# Patient Record
Sex: Male | Born: 1970 | Race: White | Hispanic: No | Marital: Single | Smoking: Former smoker
Health system: Southern US, Community
[De-identification: ages and names within clinical notes are randomized; demographics above are authoritative.]

## PROBLEM LIST (undated history)

## (undated) DIAGNOSIS — F17200 Nicotine dependence, unspecified, uncomplicated: Secondary | ICD-10-CM

## (undated) DIAGNOSIS — G44209 Tension-type headache, unspecified, not intractable: Secondary | ICD-10-CM

## (undated) HISTORY — DX: Nicotine dependence, unspecified, uncomplicated: F17.200

## (undated) HISTORY — DX: Tension-type headache, unspecified, not intractable: G44.209

---

## 2007-09-20 ENCOUNTER — Ambulatory Visit: Payer: Self-pay | Admitting: Internal Medicine

## 2008-04-03 ENCOUNTER — Ambulatory Visit: Payer: Self-pay | Admitting: Internal Medicine

## 2008-04-03 DIAGNOSIS — G44209 Tension-type headache, unspecified, not intractable: Secondary | ICD-10-CM

## 2008-04-03 DIAGNOSIS — R51 Headache: Secondary | ICD-10-CM | POA: Insufficient documentation

## 2008-04-03 DIAGNOSIS — R519 Headache, unspecified: Secondary | ICD-10-CM | POA: Insufficient documentation

## 2008-04-03 DIAGNOSIS — R42 Dizziness and giddiness: Secondary | ICD-10-CM | POA: Insufficient documentation

## 2008-04-03 HISTORY — DX: Tension-type headache, unspecified, not intractable: G44.209

## 2008-04-27 ENCOUNTER — Ambulatory Visit: Payer: Self-pay | Admitting: Internal Medicine

## 2008-05-26 ENCOUNTER — Ambulatory Visit: Payer: Self-pay | Admitting: Family Medicine

## 2009-02-19 ENCOUNTER — Ambulatory Visit: Payer: Self-pay | Admitting: Internal Medicine

## 2009-02-19 DIAGNOSIS — J029 Acute pharyngitis, unspecified: Secondary | ICD-10-CM | POA: Insufficient documentation

## 2009-07-21 ENCOUNTER — Ambulatory Visit: Payer: Self-pay | Admitting: Internal Medicine

## 2009-07-21 LAB — CONVERTED CEMR LAB
ALT: 24 units/L (ref 0–53)
AST: 29 units/L (ref 0–37)
Albumin: 4.1 g/dL (ref 3.5–5.2)
Alkaline Phosphatase: 44 units/L (ref 39–117)
BUN: 19 mg/dL (ref 6–23)
Basophils Absolute: 0 10*3/uL (ref 0.0–0.1)
Basophils Relative: 0.8 % (ref 0.0–3.0)
Bilirubin Urine: NEGATIVE
Bilirubin, Direct: 0.1 mg/dL (ref 0.0–0.3)
CO2: 28 meq/L (ref 19–32)
Calcium: 9.4 mg/dL (ref 8.4–10.5)
Chloride: 103 meq/L (ref 96–112)
Cholesterol: 175 mg/dL (ref 0–200)
Creatinine, Ser: 1.1 mg/dL (ref 0.4–1.5)
Eosinophils Absolute: 0.2 10*3/uL (ref 0.0–0.7)
Eosinophils Relative: 3.3 % (ref 0.0–5.0)
GFR calc non Af Amer: 78.55 mL/min (ref >60)
Glucose, Bld: 97 mg/dL (ref 70–99)
Glucose, Urine, Semiquant: NEGATIVE
HCT: 45.4 % (ref 39.0–52.0)
HDL: 40.2 mg/dL (ref >39.00)
Hemoglobin: 15.3 g/dL (ref 13.0–17.0)
Ketones, urine, test strip: NEGATIVE
LDL Cholesterol: 124 mg/dL — ABNORMAL HIGH (ref 0–99)
Lymphocytes Relative: 41.8 % (ref 12.0–46.0)
Lymphs Abs: 2.5 10*3/uL (ref 0.7–4.0)
MCHC: 33.8 g/dL (ref 30.0–36.0)
MCV: 96.4 fL (ref 78.0–100.0)
Monocytes Absolute: 0.7 10*3/uL (ref 0.1–1.0)
Monocytes Relative: 12.5 % — ABNORMAL HIGH (ref 3.0–12.0)
Neutro Abs: 2.5 10*3/uL (ref 1.4–7.7)
Neutrophils Relative %: 41.6 % — ABNORMAL LOW (ref 43.0–77.0)
Nitrite: NEGATIVE
Platelets: 227 10*3/uL (ref 150.0–400.0)
Potassium: 4.5 meq/L (ref 3.5–5.1)
Protein, U semiquant: NEGATIVE
RBC: 4.71 M/uL (ref 4.22–5.81)
RDW: 12.1 % (ref 11.5–14.6)
Sodium: 139 meq/L (ref 135–145)
Specific Gravity, Urine: 1.025
TSH: 1.36 microintl units/mL (ref 0.35–5.50)
Total Bilirubin: 0.7 mg/dL (ref 0.3–1.2)
Total CHOL/HDL Ratio: 4
Total Protein: 7.1 g/dL (ref 6.0–8.3)
Triglycerides: 54 mg/dL (ref 0.0–149.0)
Urobilinogen, UA: 0.2
VLDL: 10.8 mg/dL (ref 0.0–40.0)
WBC Urine, dipstick: NEGATIVE
WBC: 5.9 10*3/uL (ref 4.5–10.5)
pH: 5

## 2009-08-05 ENCOUNTER — Ambulatory Visit: Payer: Self-pay | Admitting: Internal Medicine

## 2009-08-05 DIAGNOSIS — F17200 Nicotine dependence, unspecified, uncomplicated: Secondary | ICD-10-CM | POA: Insufficient documentation

## 2009-08-05 HISTORY — DX: Nicotine dependence, unspecified, uncomplicated: F17.200

## 2010-02-11 ENCOUNTER — Telehealth: Payer: Self-pay | Admitting: Internal Medicine

## 2010-11-15 NOTE — Assessment & Plan Note (Signed)
Summary: cpx/cjr   Vital Signs:  Patient profile:   40 year old male Weight:      210 pounds BMI:     28.58 BP sitting:   140 / 86  (left arm) Cuff size:   regular  Vitals Entered By: Raechel Ache, RN (August 05, 2009 8:56 AM) CC: CPX, labs done. Is Patient Diabetic? No   CC:  CPX and labs done.Marland Kitchen  History of Present Illness: 40 year old patient who is seen today for  health maintenance exam.  He enjoys excellent health and takes no chronic medications.  Preventive Screening-Counseling & Management  Alcohol-Tobacco     Smoking Status: current  Caffeine-Diet-Exercise     Does Patient Exercise: yes  Allergies: No Known Drug Allergies  Past History:  Past Medical History: Reviewed history from 04/03/2008 and no changes required. Headache  Past Surgical History: Reviewed history from 04/03/2008 and no changes required. none  Family History: Reviewed history from 04/03/2008 and no changes required. father age 51 in good health mother is 47.  History of hypertension since age 80, IGT   Two sisters with hypertension, obesity  Social History: Reviewed history from 04/03/2008 and no changes required. Single gay-no present domestic partner Regular exercise-yes Smoking Status:  current Does Patient Exercise:  yes  Review of Systems  The patient denies anorexia, fever, weight loss, weight gain, vision loss, decreased hearing, hoarseness, chest pain, syncope, dyspnea on exertion, peripheral edema, prolonged cough, headaches, hemoptysis, abdominal pain, melena, hematochezia, severe indigestion/heartburn, hematuria, incontinence, genital sores, muscle weakness, suspicious skin lesions, transient blindness, difficulty walking, depression, unusual weight change, abnormal bleeding, enlarged lymph nodes, angioedema, breast masses, and testicular masses.    Physical Exam  General:  Well-developed,well-nourished,in no acute distress; alert,appropriate and cooperative  throughout examination; 122/82 Head:  Normocephalic and atraumatic without obvious abnormalities. No apparent alopecia or balding. Eyes:  No corneal or conjunctival inflammation noted. EOMI. Perrla. Funduscopic exam benign, without hemorrhages, exudates or papilledema. Vision grossly normal. Ears:  External ear exam shows no significant lesions or deformities.  Otoscopic examination reveals clear canals, tympanic membranes are intact bilaterally without bulging, retraction, inflammation or discharge. Hearing is grossly normal bilaterally. Nose:  External nasal examination shows no deformity or inflammation. Nasal mucosa are pink and moist without lesions or exudates. Mouth:  Oral mucosa and oropharynx without lesions or exudates.  Teeth in good repair. Neck:  No deformities, masses, or tenderness noted. Chest Wall:  No deformities, masses, tenderness or gynecomastia noted. Breasts:  No masses or gynecomastia noted Lungs:  Normal respiratory effort, chest expands symmetrically. Lungs are clear to auscultation, no crackles or wheezes. Heart:  Normal rate and regular rhythm. S1 and S2 normal without gallop, murmur, click, rub or other extra sounds. Abdomen:  Bowel sounds positive,abdomen soft and non-tender without masses, organomegaly or hernias noted. Rectal:  No external abnormalities noted. Normal sphincter tone. No rectal masses or tenderness. Genitalia:  Testes bilaterally descended without nodularity, tenderness or masses. No scrotal masses or lesions. No penis lesions or urethral discharge. Prostate:  Prostate gland firm and smooth, no enlargement, nodularity, tenderness, mass, asymmetry or induration. Msk:  No deformity or scoliosis noted of thoracic or lumbar spine.   Pulses:  R and L carotid,radial,femoral,dorsalis pedis and posterior tibial pulses are full and equal bilaterally Extremities:  No clubbing, cyanosis, edema, or deformity noted with normal full range of motion of all joints.    Neurologic:  No cranial nerve deficits noted. Station and gait are normal. Plantar reflexes are down-going bilaterally.  DTRs are symmetrical throughout. Sensory, motor and coordinative functions appear intact. Skin:  Intact without suspicious lesions or rashes Cervical Nodes:  No lymphadenopathy noted Axillary Nodes:  No palpable lymphadenopathy Inguinal Nodes:  No significant adenopathy Psych:  Cognition and judgment appear intact. Alert and cooperative with normal attention span and concentration. No apparent delusions, illusions, hallucinations   Impression & Recommendations:  Problem # 1:  PREVENTIVE HEALTH CARE (ICD-V70.0)  Complete Medication List: 1)  Propecia 1 Mg Tabs (Finasteride) .Marland Kitchen.. 1 once daily  Patient Instructions: 1)  It is important that you exercise regularly at least 20 minutes 5 times a week. If you develop chest pain, have severe difficulty breathing, or feel very tired , stop exercising immediately and seek medical attention. Prescriptions: PROPECIA 1 MG  TABS (FINASTERIDE) 1 once daily  #90 x 6   Entered and Authorized by:   Gordy Savers  MD   Signed by:   Gordy Savers  MD on 08/05/2009   Method used:   Print then Give to Patient   RxID:   [id number]

## 2010-11-15 NOTE — Progress Notes (Signed)
Summary:  Propecia.refill  Phone Note Call from Patient Call back at [facility] Phone [phone]   Caller: Patient Summary of Call: Pt lost his wriiten script for Propecia. Please call in to [facility] on [address] [phone] Initial call taken by: Lucy Antigua,  February 11, 2010 1:37 PM    Prescriptions: PROPECIA 1 MG  TABS (FINASTERIDE) 1 once daily  #90 x 2   Entered by:   Duard Brady LPN   Authorized by:   Gordy Savers  MD   Signed by:   Duard Brady LPN on 47/82/9562   Method used:   Faxed to ...       [facility]  [address]  [phone]* (retail)       [address]       [place], [place]  [address]       Ph: [phone] or [phone]       Fax: [phone]   RxID:   [id number]

## 2011-04-18 ENCOUNTER — Other Ambulatory Visit: Payer: Self-pay | Admitting: Internal Medicine

## 2011-04-20 ENCOUNTER — Other Ambulatory Visit: Payer: Self-pay | Admitting: Internal Medicine

## 2011-05-25 ENCOUNTER — Telehealth: Payer: Self-pay | Admitting: Internal Medicine

## 2011-05-25 NOTE — Telephone Encounter (Signed)
It is concerned with his prescription of PROPECIA 1 MG tablet  because he is having issues refilling it every month. Please contact pt.

## 2011-05-29 NOTE — Telephone Encounter (Signed)
LMTCB if questions - last seen 2010 , keep appt 06/13/11 and will give 90day rx with 3RF at that time

## 2011-06-02 ENCOUNTER — Other Ambulatory Visit (INDEPENDENT_AMBULATORY_CARE_PROVIDER_SITE_OTHER): Payer: BC Managed Care – PPO

## 2011-06-02 DIAGNOSIS — Z0000 Encounter for general adult medical examination without abnormal findings: Secondary | ICD-10-CM

## 2011-06-02 LAB — HEPATIC FUNCTION PANEL
ALT: 21 U/L (ref 0–53)
AST: 26 U/L (ref 0–37)
Albumin: 4.2 g/dL (ref 3.5–5.2)
Alkaline Phosphatase: 46 U/L (ref 39–117)
Bilirubin, Direct: 0.1 mg/dL (ref 0.0–0.3)
Total Bilirubin: 0.6 mg/dL (ref 0.3–1.2)
Total Protein: 7 g/dL (ref 6.0–8.3)

## 2011-06-02 LAB — CBC WITH DIFFERENTIAL/PLATELET
Basophils Absolute: 0.1 10*3/uL (ref 0.0–0.1)
Basophils Relative: 0.8 % (ref 0.0–3.0)
Eosinophils Absolute: 0 10*3/uL (ref 0.0–0.7)
Eosinophils Relative: 0 % (ref 0.0–5.0)
HCT: 44 % (ref 39.0–52.0)
Hemoglobin: 14.6 g/dL (ref 13.0–17.0)
Lymphocytes Relative: 36.4 % (ref 12.0–46.0)
Lymphs Abs: 2.4 10*3/uL (ref 0.7–4.0)
MCHC: 33.2 g/dL (ref 30.0–36.0)
MCV: 96.8 fl (ref 78.0–100.0)
Monocytes Absolute: 0.9 10*3/uL (ref 0.1–1.0)
Monocytes Relative: 12.9 % — ABNORMAL HIGH (ref 3.0–12.0)
Neutro Abs: 3.3 10*3/uL (ref 1.4–7.7)
Neutrophils Relative %: 49.9 % (ref 43.0–77.0)
Platelets: 246 10*3/uL (ref 150.0–400.0)
RBC: 4.54 Mil/uL (ref 4.22–5.81)
RDW: 13.8 % (ref 11.5–14.6)
WBC: 6.7 10*3/uL (ref 4.5–10.5)

## 2011-06-02 LAB — BASIC METABOLIC PANEL
BUN: 18 mg/dL (ref 6–23)
CO2: 26 mEq/L (ref 19–32)
Calcium: 9.1 mg/dL (ref 8.4–10.5)
Chloride: 106 mEq/L (ref 96–112)
Creatinine, Ser: 1 mg/dL (ref 0.4–1.5)
GFR: 86.88 mL/min (ref >60.00)
Glucose, Bld: 97 mg/dL (ref 70–99)
Potassium: 4.5 mEq/L (ref 3.5–5.1)
Sodium: 140 mEq/L (ref 135–145)

## 2011-06-02 LAB — POCT URINALYSIS DIPSTICK
Bilirubin, UA: NEGATIVE
Blood, UA: NEGATIVE
Glucose, UA: NEGATIVE
Ketones, UA: NEGATIVE
Leukocytes, UA: NEGATIVE
Nitrite, UA: NEGATIVE
Protein, UA: NEGATIVE
Spec Grav, UA: 1.025
Urobilinogen, UA: 0.2
pH, UA: 5

## 2011-06-02 LAB — TSH: TSH: 1 u[IU]/mL (ref 0.35–5.50)

## 2011-06-02 LAB — LIPID PANEL
Cholesterol: 181 mg/dL (ref 0–200)
HDL: 49.7 mg/dL (ref >39.00)
LDL Cholesterol: 117 mg/dL — ABNORMAL HIGH (ref 0–99)
Total CHOL/HDL Ratio: 4
Triglycerides: 74 mg/dL (ref 0.0–149.0)
VLDL: 14.8 mg/dL (ref 0.0–40.0)

## 2011-06-02 LAB — PSA: PSA: 0.35 ng/mL (ref 0.10–4.00)

## 2011-06-12 ENCOUNTER — Encounter: Payer: Self-pay | Admitting: Internal Medicine

## 2011-06-13 ENCOUNTER — Ambulatory Visit (INDEPENDENT_AMBULATORY_CARE_PROVIDER_SITE_OTHER): Payer: BC Managed Care – PPO | Admitting: Internal Medicine

## 2011-06-13 ENCOUNTER — Encounter: Payer: Self-pay | Admitting: Internal Medicine

## 2011-06-13 VITALS — BP 122/80 | HR 68 | Temp 98.3°F | Resp 16 | Ht 72.75 in | Wt 218.0 lb

## 2011-06-13 DIAGNOSIS — Z0000 Encounter for general adult medical examination without abnormal findings: Secondary | ICD-10-CM

## 2011-06-13 MED ORDER — FINASTERIDE 1 MG PO TABS: 1.0000 mg | ORAL_TABLET | Freq: Every day | ORAL | Status: DC

## 2011-06-13 NOTE — Progress Notes (Signed)
Subjective:    Patient ID: Lee Holt, male    DOB: 04-09-1971, 40 y.o.   MRN: [id number]  HPI History of Present Illness:   40 year-old patient who is seen today for health maintenance exam. He enjoys excellent health and takes no chronic medications.   Preventive Screening-Counseling & Management  Alcohol-Tobacco  Smoking Status: current  Caffeine-Diet-Exercise  Does Patient Exercise: yes   Allergies:  No Known Drug Allergies   Past History:  Past Medical History:  Reviewed history from 04/03/2008 and no changes required.  Headache   Past Surgical History:  Reviewed history from 04/03/2008 and no changes required.  None   Family History:  Reviewed history from 04/03/2008 and no changes required.  father age 69 in good health  mother is 20. History of hypertension since age 66, IGT  Two sisters with hypertension, obesity   Social History:  Reviewed history from 04/03/2008 and no changes required.  Single  gay-no present domestic partner  Regular exercise-yes  Smoking Status: current  Does Patient Exercise: yes    Review of Systems  Constitutional: Negative for fever, chills, activity change, appetite change and fatigue.  HENT: Negative for hearing loss, ear pain, congestion, rhinorrhea, sneezing, mouth sores, trouble swallowing, neck pain, neck stiffness, dental problem, voice change, sinus pressure and tinnitus.   Eyes: Negative for photophobia, pain, redness and visual disturbance.  Respiratory: Negative for apnea, cough, choking, chest tightness, shortness of breath and wheezing.   Cardiovascular: Negative for chest pain, palpitations and leg swelling.  Gastrointestinal: Negative for nausea, vomiting, abdominal pain, diarrhea, constipation, blood in stool, abdominal distention, anal bleeding and rectal pain.  Genitourinary: Negative for dysuria, urgency, frequency, hematuria, flank pain, decreased urine volume, discharge, penile swelling, scrotal swelling,  difficulty urinating, genital sores and testicular pain.  Musculoskeletal: Negative for myalgias, back pain, joint swelling, arthralgias and gait problem.  Skin: Negative for color change, rash and wound.  Neurological: Negative for dizziness, tremors, seizures, syncope, facial asymmetry, speech difficulty, weakness, light-headedness, numbness and headaches.  Hematological: Negative for adenopathy. Does not bruise/bleed easily.  Psychiatric/Behavioral: Negative for suicidal ideas, hallucinations, behavioral problems, confusion, sleep disturbance, self-injury, dysphoric mood, decreased concentration and agitation. The patient is not nervous/anxious.        Objective:   Physical Exam  Constitutional: He appears well-developed and well-nourished.  HENT:  Head: Normocephalic and atraumatic.  Right Ear: External ear normal.  Left Ear: External ear normal.  Nose: Nose normal.  Mouth/Throat: Oropharynx is clear and moist.  Eyes: Conjunctivae and EOM are normal. Pupils are equal, round, and reactive to light. No scleral icterus.  Neck: Normal range of motion. Neck supple. No JVD present. No thyromegaly present.  Cardiovascular: Regular rhythm, normal heart sounds and intact distal pulses.  Exam reveals no gallop and no friction rub.   No murmur heard. Pulmonary/Chest: Effort normal and breath sounds normal. He exhibits no tenderness.  Abdominal: Soft. Bowel sounds are normal. He exhibits no distension and no mass. There is no tenderness.  Genitourinary: Prostate normal and penis normal.  Musculoskeletal: Normal range of motion. He exhibits no edema and no tenderness.  Lymphadenopathy:    He has no cervical adenopathy.  Neurological: He is alert. He has normal reflexes. No cranial nerve deficit. Coordination normal.  Skin: Skin is warm and dry. No rash noted.  Psychiatric: He has a normal mood and affect. His behavior is normal.          Assessment & Plan:    Preventive health Ongoing  tobacco use  Laboratory studies were reviewed with the patient. Smoking cessation encouraged

## 2011-06-13 NOTE — Patient Instructions (Signed)
Smoking tobacco is very bad for your health. You should stop smoking immediately.    It is important that you exercise regularly, at least 20 minutes 3 to 4 times per week.  If you develop chest pain or shortness of breath seek  medical attention.   

## 2011-06-26 ENCOUNTER — Telehealth: Payer: Self-pay

## 2011-06-26 NOTE — Telephone Encounter (Signed)
VM - would like rx for finasteride 5mg  - co pay monthly is very low for generic at 5mg . Brand is too exspensive Please advise

## 2011-06-26 NOTE — Telephone Encounter (Signed)
#  90  RF 3 

## 2011-06-27 MED ORDER — FINASTERIDE 5 MG PO TABS: 5.0000 mg | ORAL_TABLET | Freq: Every day | ORAL | Status: AC

## 2011-06-27 NOTE — Telephone Encounter (Signed)
Change to med list done and new rx sent kik

## 2011-08-16 ENCOUNTER — Telehealth: Payer: Self-pay | Admitting: Internal Medicine

## 2011-08-16 NOTE — Telephone Encounter (Signed)
Last seen in office 06/12/12 - I do not see this on his current med list - please advise

## 2011-08-16 NOTE — Telephone Encounter (Signed)
Pt called and said that he has a cold sore on lip. Pt is req a script for Valtrex to bed called in to [facility] on [address]. Pt is aware that pcp is out of office. Pt would like to be notified when this has been called in.

## 2011-08-17 MED ORDER — ACYCLOVIR 400 MG PO TABS: 400.0000 mg | ORAL_TABLET | Freq: Every day | ORAL | Status: AC

## 2011-08-17 NOTE — Telephone Encounter (Signed)
Acyclovir 400 mg, dispense 50 tabs......directions one tab 5 times a day for cold, sore, refills x 2

## 2012-05-08 ENCOUNTER — Telehealth: Payer: Self-pay | Admitting: Internal Medicine

## 2012-05-08 MED ORDER — ACYCLOVIR 400 MG PO TABS: 400.0000 mg | ORAL_TABLET | ORAL | Status: AC

## 2012-05-08 NOTE — Telephone Encounter (Signed)
Please advise 

## 2012-05-08 NOTE — Telephone Encounter (Signed)
Acyclovir 400 #10 one twice a day for 5 days  refill x3

## 2012-05-08 NOTE — Telephone Encounter (Signed)
Pt is going to be traveling out of town and has a cold sore. Pt is req to get a refill of acyclovir (ZOVIRAX) 400 MG tablet or Valtrex called in to [facility] on [address] asap today. Pt says that if this can not be done today, pls call.

## 2012-05-08 NOTE — Telephone Encounter (Signed)
Done - [facility] - with instructions for them to call when ready for pick up

## 2012-08-08 ENCOUNTER — Encounter: Payer: Self-pay | Admitting: Sports Medicine

## 2012-08-08 ENCOUNTER — Ambulatory Visit (INDEPENDENT_AMBULATORY_CARE_PROVIDER_SITE_OTHER): Payer: BC Managed Care – PPO | Admitting: Sports Medicine

## 2012-08-08 VITALS — BP 132/82 | HR 73 | Ht 72.0 in | Wt 190.0 lb

## 2012-08-08 DIAGNOSIS — M25529 Pain in unspecified elbow: Secondary | ICD-10-CM

## 2012-08-08 DIAGNOSIS — M7700 Medial epicondylitis, unspecified elbow: Secondary | ICD-10-CM

## 2012-08-08 DIAGNOSIS — M7701 Medial epicondylitis, right elbow: Secondary | ICD-10-CM | POA: Insufficient documentation

## 2012-08-08 MED ORDER — MELOXICAM 15 MG PO TABS: 15.0000 mg | ORAL_TABLET | Freq: Every day | ORAL | Status: DC | PRN

## 2012-08-08 NOTE — Progress Notes (Signed)
Patient ID: Lee Holt, male   DOB: [birth year], 41 y.o.   MRN: [id number]  Mr. Camilo Libengood is a 41 y/o male complaining of right elbow, medial, pain x 2 months.  Pt recently started playing with new competition and has been hitting the ball harder, getting more torque on ball because of increased competition.  Pt recently adjusted his swing, string tension and grip within this same time.  Pt plays at level 4/5 and is playing against level 5 competition.    Pt started feeling radiation from medial elbow to wrist during recent matches.  Pt also had recent string adjustment.  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    ROS - History of migraine headache   OBJECTIVE: Vital signs as noted above. Appearance: Well appearing male in no acute distress  Elbow exam: No gross erythema, edema or abnormalities on inspection of elbow joint. Right arm is approximately 6 cm longer than left in complete flexion with elbow extended Right forearm and hand musculature asymmetric compared to left, demonstrating significant hypertrophy of flexor surface and intrinsic hand musculature  AROM is symmetric and appropriate in bilateral wrist and elbows in flexion and extension; no restrict moving RIGHT arm into complete extension. 5/5 strength in bilateral wrist flexion, extension but pain reproduced with slight resistance on wrist flexion.  Minimal pain with resisted flexion of lateral three fingers. Exquisite point tenderness at distal aspect of medial epicondyle of humerus on RIGHT but no palpable defects or crepitus   RIGHT elbow MSK U/S: Pt tender to pressure from U/S probe at medial epicondyle of RIGHT arm.  Visualization of common flexor tendon revealed calcification at medial epicondyle at attachment of flexor tendon group.  Hypoechoic regions visualized near attachment of group at epicondyle in multiple points and  proximal to insertion.  Small collections of fluid present in same areas noted above. neovessels noted in 2 areas of the tendon complex  ASSESSMENT: Medial Epicondylitis of Right elbow, Acute   PLAN: History and physical exam suggestive of medial epicondylitis secondary to increased training/match intensity, new swing, new grip and change in string tension.  Sonographic evidence of epicondylitis without overt tear on U/S.  Pt provided with rehabilitation exercises for wrist and fingers to address flexor compartment.  Pt also given body helix forearm sleeve, oral meloxicam (scheduled 7 days, then prn) and instructed to pickup topical asper cream.  Pt agreed with above treatment plan and will f/u as needed.  Instructed to continue training at lower intensity and play/practice at pain free level of intensity.  Pt will f/u 4 weeks  - Kenney Houseman, MS IV

## 2012-08-08 NOTE — Patient Instructions (Addendum)
Wrist / Finger Rehab Exercises Wrist: 1 - Move into wrist extension (slow) then flexion (fast) with light weight, 3 pounds 2 - Move into pronation or "palm down" (slow) then supination "palm up" (fast)  Fingers: 3 - Repeat extension (slow) and flexion (fast) with fingers, using a therabad we provided  - Please wear elbow sleeve during exercise and at least 1 hour afterward - Please use topical asper cream - Please use mobic (meloxicam) 7.5mg  daily for seven days, then as needed

## 2012-08-08 NOTE — Assessment & Plan Note (Signed)
We will give him meloxicam to use orally. Once this is less painful he should rely more on topical analgesics such as Aspercreme.

## 2012-08-08 NOTE — Assessment & Plan Note (Signed)
Begin a series of flexion exercises pain at rehabilitation of the right medial epicondyle   elbow compression sleeve for tennis and keep on for one hour afterwards  Ice after activity  Change tennis strings to 50 pounds or below

## 2012-09-05 ENCOUNTER — Ambulatory Visit: Payer: BC Managed Care – PPO | Admitting: Sports Medicine

## 2012-10-23 ENCOUNTER — Other Ambulatory Visit: Payer: Self-pay | Admitting: Internal Medicine

## 2013-03-03 ENCOUNTER — Other Ambulatory Visit: Payer: Self-pay | Admitting: Internal Medicine

## 2013-03-04 NOTE — Telephone Encounter (Signed)
PT called to inquire about this refill, he also stated that he will be leaving town soon. Please assist.

## 2013-03-25 ENCOUNTER — Other Ambulatory Visit: Payer: Self-pay | Admitting: Internal Medicine

## 2013-04-10 ENCOUNTER — Other Ambulatory Visit (INDEPENDENT_AMBULATORY_CARE_PROVIDER_SITE_OTHER): Payer: BC Managed Care – PPO

## 2013-04-10 DIAGNOSIS — Z0000 Encounter for general adult medical examination without abnormal findings: Secondary | ICD-10-CM

## 2013-04-10 LAB — POCT URINALYSIS DIPSTICK
Bilirubin, UA: NEGATIVE
Glucose, UA: NEGATIVE
Ketones, UA: NEGATIVE
Leukocytes, UA: NEGATIVE
Nitrite, UA: NEGATIVE
Protein, UA: NEGATIVE
Spec Grav, UA: >=1.03
Urobilinogen, UA: 0.2
pH, UA: 5

## 2013-04-10 LAB — CBC WITH DIFFERENTIAL/PLATELET
Basophils Absolute: 0 10*3/uL (ref 0.0–0.1)
Basophils Relative: 0.6 % (ref 0.0–3.0)
Eosinophils Absolute: 0.1 10*3/uL (ref 0.0–0.7)
Eosinophils Relative: 1.8 % (ref 0.0–5.0)
HCT: 45.6 % (ref 39.0–52.0)
Hemoglobin: 15.4 g/dL (ref 13.0–17.0)
Lymphocytes Relative: 39.9 % (ref 12.0–46.0)
Lymphs Abs: 2.9 10*3/uL (ref 0.7–4.0)
MCHC: 33.8 g/dL (ref 30.0–36.0)
MCV: 96.9 fl (ref 78.0–100.0)
Monocytes Absolute: 0.9 10*3/uL (ref 0.1–1.0)
Monocytes Relative: 12.6 % — ABNORMAL HIGH (ref 3.0–12.0)
Neutro Abs: 3.3 10*3/uL (ref 1.4–7.7)
Neutrophils Relative %: 45.1 % (ref 43.0–77.0)
Platelets: 277 10*3/uL (ref 150.0–400.0)
RBC: 4.71 Mil/uL (ref 4.22–5.81)
RDW: 13.6 % (ref 11.5–14.6)
WBC: 7.3 10*3/uL (ref 4.5–10.5)

## 2013-04-10 LAB — BASIC METABOLIC PANEL
BUN: 15 mg/dL (ref 6–23)
CO2: 26 mEq/L (ref 19–32)
Calcium: 9.3 mg/dL (ref 8.4–10.5)
Chloride: 103 mEq/L (ref 96–112)
Creatinine, Ser: 0.9 mg/dL (ref 0.4–1.5)
GFR: 92.51 mL/min (ref >60.00)
Glucose, Bld: 108 mg/dL — ABNORMAL HIGH (ref 70–99)
Potassium: 4.1 mEq/L (ref 3.5–5.1)
Sodium: 137 mEq/L (ref 135–145)

## 2013-04-10 LAB — LIPID PANEL
Cholesterol: 193 mg/dL (ref 0–200)
HDL: 44.6 mg/dL (ref >39.00)
LDL Cholesterol: 128 mg/dL — ABNORMAL HIGH (ref 0–99)
Total CHOL/HDL Ratio: 4
Triglycerides: 104 mg/dL (ref 0.0–149.0)
VLDL: 20.8 mg/dL (ref 0.0–40.0)

## 2013-04-10 LAB — HEPATIC FUNCTION PANEL
ALT: 21 U/L (ref 0–53)
AST: 26 U/L (ref 0–37)
Albumin: 4.2 g/dL (ref 3.5–5.2)
Alkaline Phosphatase: 44 U/L (ref 39–117)
Bilirubin, Direct: 0 mg/dL (ref 0.0–0.3)
Total Bilirubin: 0.4 mg/dL (ref 0.3–1.2)
Total Protein: 7.5 g/dL (ref 6.0–8.3)

## 2013-04-10 LAB — TSH: TSH: 1.76 u[IU]/mL (ref 0.35–5.50)

## 2013-04-15 ENCOUNTER — Encounter: Payer: Self-pay | Admitting: Internal Medicine

## 2013-04-15 ENCOUNTER — Ambulatory Visit (INDEPENDENT_AMBULATORY_CARE_PROVIDER_SITE_OTHER): Payer: BC Managed Care – PPO | Admitting: Internal Medicine

## 2013-04-15 VITALS — BP 140/80 | HR 63 | Temp 98.7°F | Resp 18 | Ht 72.0 in | Wt 219.0 lb

## 2013-04-15 DIAGNOSIS — Z23 Encounter for immunization: Secondary | ICD-10-CM

## 2013-04-15 DIAGNOSIS — Z0000 Encounter for general adult medical examination without abnormal findings: Secondary | ICD-10-CM

## 2013-04-15 MED ORDER — FINASTERIDE 1 MG PO TABS: ORAL_TABLET | ORAL | Status: DC

## 2013-04-15 MED ORDER — VALACYCLOVIR HCL 500 MG PO TABS: 500.0000 mg | ORAL_TABLET | Freq: Two times a day (BID) | ORAL | Status: DC

## 2013-04-15 NOTE — Progress Notes (Signed)
Patient ID: Lee Holt, male   DOB: 1971/04/12, 42 y.o.   MRN: [id number]  Subjective:    Patient ID: Lee Holt, male    DOB: 10-02-1971, 42 y.o.   MRN: [id number]  HPI History of Present Illness:   42  year-old patient who is seen today for health maintenance exam. He enjoys excellent health and takes no chronic medications.   Preventive Screening-Counseling & Management  Alcohol-Tobacco  Smoking Status: current  Caffeine-Diet-Exercise  Does Patient Exercise: yes   Allergies:  No Known Drug Allergies   Past History:  Past Medical History:  Reviewed history from 04/03/2008 and no changes required.  Headache   Past Surgical History:  Reviewed history from 04/03/2008 and no changes required.  None   Family History:  Reviewed history from 04/03/2008 and no changes required.  father age 79 in good health  mother is 43. History of hypertension since age 63, DM2 Two sisters with hypertension, obesity   Social History:  Reviewed history from 04/03/2008 and no changes required.  Single  gay-no present domestic partner  Regular exercise-yes  Smoking Status: current  1/2 ppd Does Patient Exercise: yes    Review of Systems  Constitutional: Negative for fever, chills, activity change, appetite change and fatigue.  HENT: Negative for hearing loss, ear pain, congestion, rhinorrhea, sneezing, mouth sores, trouble swallowing, neck pain, neck stiffness, dental problem, voice change, sinus pressure and tinnitus.   Eyes: Negative for photophobia, pain, redness and visual disturbance.  Respiratory: Negative for apnea, cough, choking, chest tightness, shortness of breath and wheezing.   Cardiovascular: Negative for chest pain, palpitations and leg swelling.  Gastrointestinal: Negative for nausea, vomiting, abdominal pain, diarrhea, constipation, blood in stool, abdominal distention, anal bleeding and rectal pain.  Genitourinary: Negative for dysuria, urgency, frequency, hematuria,  flank pain, decreased urine volume, discharge, penile swelling, scrotal swelling, difficulty urinating, genital sores and testicular pain.  Musculoskeletal: Negative for myalgias, back pain, joint swelling, arthralgias and gait problem.  Skin: Negative for color change, rash and wound.  Neurological: Negative for dizziness, tremors, seizures, syncope, facial asymmetry, speech difficulty, weakness, light-headedness, numbness and headaches.  Hematological: Negative for adenopathy. Does not bruise/bleed easily.  Psychiatric/Behavioral: Negative for suicidal ideas, hallucinations, behavioral problems, confusion, sleep disturbance, self-injury, dysphoric mood, decreased concentration and agitation. The patient is not nervous/anxious.        Objective:   Physical Exam  Constitutional: He appears well-developed and well-nourished.  HENT:  Head: Normocephalic and atraumatic.  Right Ear: External ear normal.  Left Ear: External ear normal.  Nose: Nose normal.  Mouth/Throat: Oropharynx is clear and moist.  Eyes: Conjunctivae and EOM are normal. Pupils are equal, round, and reactive to light. No scleral icterus.  Neck: Normal range of motion. Neck supple. No JVD present. No thyromegaly present.  Cardiovascular: Regular rhythm, normal heart sounds and intact distal pulses.  Exam reveals no gallop and no friction rub.   No murmur heard. Pulmonary/Chest: Effort normal and breath sounds normal. He exhibits no tenderness.  Abdominal: Soft. Bowel sounds are normal. He exhibits no distension and no mass. There is no tenderness.  Genitourinary: Prostate normal and penis normal.  Musculoskeletal: Normal range of motion. He exhibits no edema and no tenderness.  Lymphadenopathy:    He has no cervical adenopathy.  Neurological: He is alert. He has normal reflexes. No cranial nerve deficit. Coordination normal.  Skin: Skin is warm and dry. No rash noted.  Psychiatric: He has a normal mood and affect. His  behavior is normal.  Assessment & Plan:    Preventive health Ongoing tobacco use Possible impaired glucose tolerance Overweight  Laboratory studies were reviewed with the patient. Smoking cessation encouraged Weight loss and regular exercise regimen encouraged

## 2013-04-15 NOTE — Patient Instructions (Addendum)
Use Valtrex twice daily for 5 days as needed for outbreaks of oral ulcers    It is important that you exercise regularly, at least 20 minutes 3 to 4 times per week.  If you develop chest pain or shortness of breath seek  medical attention.  You need to lose weight.  Consider a lower calorie diet and regular exercise.Health Maintenance, Males A healthy lifestyle and preventative care can promote health and wellness.  Maintain regular health, dental, and eye exams.  Eat a healthy diet. Foods like vegetables, fruits, whole grains, low-fat dairy products, and lean protein foods contain the nutrients you need without too many calories. Decrease your intake of foods high in solid fats, added sugars, and salt. Get information about a proper diet from your caregiver, if necessary.  Regular physical exercise is one of the most important things you can do for your health. Most adults should get at least 150 minutes of moderate-intensity exercise (any activity that increases your heart rate and causes you to sweat) each week. In addition, most adults need muscle-strengthening exercises on 2 or more days a week.   Maintain a healthy weight. The body mass index (BMI) is a screening tool to identify possible weight problems. It provides an estimate of body fat based on height and weight. Your caregiver can help determine your BMI, and can help you achieve or maintain a healthy weight. For adults 20 years and older:  A BMI below 18.5 is considered underweight.  A BMI of 18.5 to 24.9 is normal.  A BMI of 25 to 29.9 is considered overweight.  A BMI of 30 and above is considered obese.  Maintain normal blood lipids and cholesterol by exercising and minimizing your intake of saturated fat. Eat a balanced diet with plenty of fruits and vegetables. Blood tests for lipids and cholesterol should begin at age 32 and be repeated every 5 years. If your lipid or cholesterol levels are high, you are over 50, or you are  a high risk for heart disease, you may need your cholesterol levels checked more frequently.Ongoing high lipid and cholesterol levels should be treated with medicines, if diet and exercise are not effective.  If you smoke, find out from your caregiver how to quit. If you do not use tobacco, do not start.  If you choose to drink alcohol, do not exceed 2 drinks per day. One drink is considered to be 12 ounces (355 mL) of beer, 5 ounces (148 mL) of wine, or 1.5 ounces (44 mL) of liquor.  Avoid use of street drugs. Do not share needles with anyone. Ask for help if you need support or instructions about stopping the use of drugs.  High blood pressure causes heart disease and increases the risk of stroke. Blood pressure should be checked at least every 1 to 2 years. Ongoing high blood pressure should be treated with medicines if weight loss and exercise are not effective.  If you are 2 to 44 years old, ask your caregiver if you should take aspirin to prevent heart disease.  Diabetes screening involves taking a blood sample to check your fasting blood sugar level. This should be done once every 3 years, after age 21, if you are within normal weight and without risk factors for diabetes. Testing should be considered at a younger age or be carried out more frequently if you are overweight and have at least 1 risk factor for diabetes.  Colorectal cancer can be detected and often prevented. Most routine  colorectal cancer screening begins at the age of 58 and continues through age 53. However, your caregiver may recommend screening at an earlier age if you have risk factors for colon cancer. On a yearly basis, your caregiver may provide home test kits to check for hidden blood in the stool. Use of a small camera at the end of a tube, to directly examine the colon (sigmoidoscopy or colonoscopy), can detect the earliest forms of colorectal cancer. Talk to your caregiver about this at age 54, when routine screening  begins. Direct examination of the colon should be repeated every 5 to 10 years through age 39, unless early forms of pre-cancerous polyps or small growths are found.  Hepatitis C blood testing is recommended for all people born from [birth year] through 1965 and any individual with known risks for hepatitis C.  Healthy men should no longer receive prostate-specific antigen (PSA) blood tests as part of routine cancer screening. Consult with your caregiver about prostate cancer screening.  Testicular cancer screening is not recommended for adolescents or adult males who have no symptoms. Screening includes self-exam, caregiver exam, and other screening tests. Consult with your caregiver about any symptoms you have or any concerns you have about testicular cancer.  Practice safe sex. Use condoms and avoid high-risk sexual practices to reduce the spread of sexually transmitted infections (STIs).  Use sunscreen with a sun protection factor (SPF) of 30 or greater. Apply sunscreen liberally and repeatedly throughout the day. You should seek shade when your shadow is shorter than you. Protect yourself by wearing long sleeves, pants, a wide-brimmed hat, and sunglasses year round, whenever you are outdoors.  Notify your caregiver of new moles or changes in moles, especially if there is a change in shape or color. Also notify your caregiver if a mole is larger than the size of a pencil eraser.  A one-time screening for abdominal aortic aneurysm (AAA) and surgical repair of large AAAs by sound wave imaging (ultrasonography) is recommended for ages 106 to 60 years who are current or former smokers.  Stay current with your immunizations. Document Released: 03/30/2008 Document Revised: 12/25/2011 Document Reviewed: 02/27/2011 [facility] Patient Information 2014 [place], [place].

## 2014-01-04 ENCOUNTER — Other Ambulatory Visit: Payer: Self-pay | Admitting: Internal Medicine

## 2014-05-05 ENCOUNTER — Other Ambulatory Visit: Payer: Self-pay | Admitting: Internal Medicine

## 2014-05-07 ENCOUNTER — Ambulatory Visit (INDEPENDENT_AMBULATORY_CARE_PROVIDER_SITE_OTHER): Payer: BC Managed Care – PPO | Admitting: Sports Medicine

## 2014-05-07 ENCOUNTER — Encounter: Payer: Self-pay | Admitting: Sports Medicine

## 2014-05-07 ENCOUNTER — Other Ambulatory Visit: Payer: Self-pay | Admitting: Internal Medicine

## 2014-05-07 VITALS — BP 122/77 | Ht 72.0 in | Wt 200.0 lb

## 2014-05-07 DIAGNOSIS — M25569 Pain in unspecified knee: Secondary | ICD-10-CM

## 2014-05-07 DIAGNOSIS — M25561 Pain in right knee: Secondary | ICD-10-CM | POA: Insufficient documentation

## 2014-05-07 NOTE — Progress Notes (Signed)
  Lee Holt - 43 y.o. male MRN [id number]  Date of birth: 03/05/1971    SUBJECTIVE:     Lee Holt reports lateral right knee pain beginning in February/March.  He does not recall any specific injury but thinks and maybe do to overly aggressive yoga.  He notices the pain most when playing tennis.  Does notice some mild swelling with physical activity, but denies any locking or giving way.  Pain is greatest with knee flexion.  He has been wearing his friends knee brace with rigid support and has noticed some improvement with this.  He has also tried some dry needling with minimal improvement.  He denies any previous right knee or hip injuries or surgeries.   ROS:     Denies fevers, chills, or erythema  PERTINENT  PMH / PSH FH / / SH:  Past Medical, Surgical, Social, and Family History Reviewed & Updated in the EMR.  Pertinent findings include:   None  OBJECTIVE: BP 122/77  Ht 6' (1.829 m)  Wt 200 lb (90.719 kg)  BMI 27.12 kg/m2  Physical Exam:  Vital signs are reviewed.  Right Knee: Normal to inspection with no erythema or effusion or obvious bony abnormalities. Palpation: Tenderness along lateral tibial plateau and femoral condyle; Otherwise normal with no warmth, joint line tenderness, patellar tenderness, or medial condyle tenderness. ROM full in flexion and extension; however flexion is painful at 30 deg and then feels snap and pain relief as he squats past this level  Ligaments with solid consistent endpoints including ACL, PCL, LCL, MCL. Negative Mcmurray's, Apley's, and anterior/posterior drawer Gets some pain on thessaly and on modified Nyland test  Non painful patellar compression. Patellar glide with mild crepitus. Patellar and quadriceps tendons unremarkable. Hamstring and quadriceps strength is normal.   Right knee ultrasound - IT band calcifications visualized/ some hypoechoic changes - Quadriceps and patellar tendons normal/  SP pouch visualized without effusion;  MCL and medial meniscus as well as LCL and lateral meniscus appear intact.  Popliteus visualized without surrounding edema   ASSESSMENT & PLAN:  See problem based charting & AVS for pt instructions.

## 2014-05-07 NOTE — Assessment & Plan Note (Signed)
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  ITB calcifications visualized on ultrasound - Fitted with knee compression sleeve - Patient instructed on IT band stretches and exercises - Discussed icing ITB post physical activity as well as foam roller - Followup in 4-6 weeks for reevaluation

## 2014-05-07 NOTE — Patient Instructions (Signed)
It was great seeing you today.   1. Do IT band stretches and exercises > 4 times a week 1. Build up to weighted lunges: Straight, lateral and medial: 3 sets of 15 2. Use compression sleeve when physically active especially playing tennis 3. You can Ice your IT band after playing tennis: Frozen dixie cup 4. F/u in 4-6 weeks  Next Appointment  Make an appointment with Dr Darrick PennaFields in 4-6 weeks   Take Care,   Dr Wenda LowJames Jakarius Flamenco

## 2014-05-14 ENCOUNTER — Ambulatory Visit: Payer: BC Managed Care – PPO | Admitting: Sports Medicine

## 2014-06-08 ENCOUNTER — Ambulatory Visit (INDEPENDENT_AMBULATORY_CARE_PROVIDER_SITE_OTHER): Payer: BC Managed Care – PPO | Admitting: Internal Medicine

## 2014-06-08 ENCOUNTER — Encounter: Payer: Self-pay | Admitting: Internal Medicine

## 2014-06-08 ENCOUNTER — Telehealth: Payer: Self-pay | Admitting: Internal Medicine

## 2014-06-08 VITALS — BP 130/80 | HR 66 | Temp 98.2°F | Resp 20 | Ht 72.0 in | Wt 212.0 lb

## 2014-06-08 DIAGNOSIS — F17200 Nicotine dependence, unspecified, uncomplicated: Secondary | ICD-10-CM

## 2014-06-08 NOTE — Patient Instructions (Signed)
Smoking tobacco is very bad for your health. You should stop smoking immediately.  Return in one year for follow-up  Health Maintenance A healthy lifestyle and preventative care can promote health and wellness.  Maintain regular health, dental, and eye exams.  Eat a healthy diet. Foods like vegetables, fruits, whole grains, low-fat dairy products, and lean protein foods contain the nutrients you need and are low in calories. Decrease your intake of foods high in solid fats, added sugars, and salt. Get information about a proper diet from your health care provider, if necessary.  Regular physical exercise is one of the most important things you can do for your health. Most adults should get at least 150 minutes of moderate-intensity exercise (any activity that increases your heart rate and causes you to sweat) each week. In addition, most adults need muscle-strengthening exercises on 2 or more days a week.   Maintain a healthy weight. The body mass index (BMI) is a screening tool to identify possible weight problems. It provides an estimate of body fat based on height and weight. Your health care provider can find your BMI and can help you achieve or maintain a healthy weight. For males 20 years and older:  A BMI below 18.5 is considered underweight.  A BMI of 18.5 to 24.9 is normal.  A BMI of 25 to 29.9 is considered overweight.  A BMI of 30 and above is considered obese.  Maintain normal blood lipids and cholesterol by exercising and minimizing your intake of saturated fat. Eat a balanced diet with plenty of fruits and vegetables. Blood tests for lipids and cholesterol should begin at age 36 and be repeated every 5 years. If your lipid or cholesterol levels are high, you are over age 107, or you are at high risk for heart disease, you may need your cholesterol levels checked more frequently.Ongoing high lipid and cholesterol levels should be treated with medicines if diet and exercise are  not working.  If you smoke, find out from your health care provider how to quit. If you do not use tobacco, do not start.  Lung cancer screening is recommended for adults aged 55-80 years who are at high risk for developing lung cancer because of a history of smoking. A yearly low-dose CT scan of the lungs is recommended for people who have at least a 30-pack-year history of smoking and are current smokers or have quit within the past 15 years. A pack year of smoking is smoking an average of 1 pack of cigarettes a day for 1 year (for example, a 30-pack-year history of smoking could mean smoking 1 pack a day for 30 years or 2 packs a day for 15 years). Yearly screening should continue until the smoker has stopped smoking for at least 15 years. Yearly screening should be stopped for people who develop a health problem that would prevent them from having lung cancer treatment.  If you choose to drink alcohol, do not have more than 2 drinks per day. One drink is considered to be 12 oz (360 mL) of beer, 5 oz (150 mL) of wine, or 1.5 oz (45 mL) of liquor.  Avoid the use of street drugs. Do not share needles with anyone. Ask for help if you need support or instructions about stopping the use of drugs.  High blood pressure causes heart disease and increases the risk of stroke. Blood pressure should be checked at least every 1-2 years. Ongoing high blood pressure should be treated with medicines  if weight loss and exercise are not effective.  If you are 1-58 years old, ask your health care provider if you should take aspirin to prevent heart disease.  Diabetes screening involves taking a blood sample to check your fasting blood sugar level. This should be done once every 3 years after age 33 if you are at a normal weight and without risk factors for diabetes. Testing should be considered at a younger age or be carried out more frequently if you are overweight and have at least 1 risk factor for  diabetes.  Colorectal cancer can be detected and often prevented. Most routine colorectal cancer screening begins at the age of 61 and continues through age 89. However, your health care provider may recommend screening at an earlier age if you have risk factors for colon cancer. On a yearly basis, your health care provider may provide home test kits to check for hidden blood in the stool. A small camera at the end of a tube may be used to directly examine the colon (sigmoidoscopy or colonoscopy) to detect the earliest forms of colorectal cancer. Talk to your health care provider about this at age 79 when routine screening begins. A direct exam of the colon should be repeated every 5-10 years through age 33, unless early forms of precancerous polyps or small growths are found.  People who are at an increased risk for hepatitis B should be screened for this virus. You are considered at high risk for hepatitis B if:  You were born in a country where hepatitis B occurs often. Talk with your health care provider about which countries are considered high risk.  Your parents were born in a high-risk country and you have not received a shot to protect against hepatitis B (hepatitis B vaccine).  You have HIV or AIDS.  You use needles to inject street drugs.  You live with, or have sex with, someone who has hepatitis B.  You are a man who has sex with other men (MSM).  You get hemodialysis treatment.  You take certain medicines for conditions like cancer, organ transplantation, and autoimmune conditions.  Hepatitis C blood testing is recommended for all people born from [birth year] through 1965 and any individual with known risk factors for hepatitis C.  Healthy men should no longer receive prostate-specific antigen (PSA) blood tests as part of routine cancer screening. Talk to your health care provider about prostate cancer screening.  Testicular cancer screening is not recommended for adolescents or  adult males who have no symptoms. Screening includes self-exam, a health care provider exam, and other screening tests. Consult with your health care provider about any symptoms you have or any concerns you have about testicular cancer.  Practice safe sex. Use condoms and avoid high-risk sexual practices to reduce the spread of sexually transmitted infections (STIs).  You should be screened for STIs, including gonorrhea and chlamydia if:  You are sexually active and are younger than 24 years.  You are older than 24 years, and your health care provider tells you that you are at risk for this type of infection.  Your sexual activity has changed since you were last screened, and you are at an increased risk for chlamydia or gonorrhea. Ask your health care provider if you are at risk.  If you are at risk of being infected with HIV, it is recommended that you take a prescription medicine daily to prevent HIV infection. This is called pre-exposure prophylaxis (PrEP). You are considered at  risk if:  You are a man who has sex with other men (MSM).  You are a heterosexual man who is sexually active with multiple partners.  You take drugs by injection.  You are sexually active with a partner who has HIV.  Talk with your health care provider about whether you are at high risk of being infected with HIV. If you choose to begin PrEP, you should first be tested for HIV. You should then be tested every 3 months for as long as you are taking PrEP.  Use sunscreen. Apply sunscreen liberally and repeatedly throughout the day. You should seek shade when your shadow is shorter than you. Protect yourself by wearing long sleeves, pants, a wide-brimmed hat, and sunglasses year round whenever you are outdoors.  Tell your health care provider of new moles or changes in moles, especially if there is a change in shape or color. Also, tell your health care provider if a mole is larger than the size of a pencil  eraser.  A one-time screening for abdominal aortic aneurysm (AAA) and surgical repair of large AAAs by ultrasound is recommended for men aged 44-75 years who are current or former smokers.  Stay current with your vaccines (immunizations). Document Released: 03/30/2008 Document Revised: 10/07/2013 Document Reviewed: 02/27/2011 [facility] Patient Information 2015 [employer], [place]. This information is not intended to replace advice given to you by your health care provider. Make sure you discuss any questions you have with your health care provider.

## 2014-06-08 NOTE — Progress Notes (Signed)
Subjective:    Patient ID: Lee Holt, male    DOB: Mar 19, 1971, 43 y.o.   MRN: [id number]  HPI  43 year old patient, who presents with a four-week history of nonspecific testicular discomfort.  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  He describes a mild nocturia, decreased urinary flow.  No other concerns or complaints.  He is seen one month ago for right knee pain  Past Medical History  Diagnosis Date  . TENSION HEADACHES, CHRONIC 04/03/2008  . TOBACCO USER 08/05/2009    History   Social History  . Marital Status: Single    Spouse Name: N/A    Number of Children: N/A  . Years of Education: N/A   Occupational History  . Not on file.   Social History Main Topics  . Smoking status: Current Every Day Smoker -- 0.50 packs/day    Types: Cigarettes  . Smokeless tobacco: Never Used  . Alcohol Use: Yes     Comment: occasionaly  . Drug Use: No  . Sexual Activity: Not on file   Other Topics Concern  . Not on file   Social History Narrative  . No narrative on file    No past surgical history on file.  Family History  Problem Relation Age of Onset  . Hypertension Mother     No Known Allergies  Current Outpatient Prescriptions on File Prior to Visit  Medication Sig Dispense Refill  . acyclovir (ZOVIRAX) 400 MG tablet Take 1 tablet by mouth every 4 (four) hours while awake .  10 tablet  2  . finasteride (PROPECIA) 1 MG tablet TAKE ONE TABLET BY MOUTH EVERY DAY  90 tablet  0  . valACYclovir (VALTREX) 500 MG tablet Take 1 tablet (500 mg total) by mouth 2 (two) times daily.  30 tablet  2   No current facility-administered medications on file prior to visit.    BP 130/80  Pulse 66  Temp(Src) 98.2 F (36.8 C) (Oral)  Resp 20  Ht 6' (1.829 m)  Wt 212 lb (96.163 kg)  BMI 28.75 kg/m2  SpO2 98%     Review of Systems  Constitutional: Negative for fever, chills, appetite change and fatigue.  HENT: Negative for congestion, dental problem, ear  pain, hearing loss, sore throat, tinnitus, trouble swallowing and voice change.   Eyes: Negative for pain, discharge and visual disturbance.  Respiratory: Negative for cough, chest tightness, wheezing and stridor.   Cardiovascular: Negative for chest pain, palpitations and leg swelling.  Gastrointestinal: Negative for nausea, vomiting, abdominal pain, diarrhea, constipation, blood in stool and abdominal distention.  Genitourinary: Positive for frequency and testicular pain. Negative for urgency, hematuria, flank pain, discharge, difficulty urinating and genital sores.  Musculoskeletal: Negative for arthralgias, back pain, gait problem, joint swelling, myalgias and neck stiffness.  Skin: Negative for rash.  Neurological: Negative for dizziness, syncope, speech difficulty, weakness, numbness and headaches.  Hematological: Negative for adenopathy. Does not bruise/bleed easily.  Psychiatric/Behavioral: Negative for behavioral problems and dysphoric mood. The patient is not nervous/anxious.        Objective:   Physical Exam  Constitutional: He is oriented to person, place, and time. He appears well-developed.  HENT:  Head: Normocephalic.  Right Ear: External ear normal.  Left Ear: External ear normal.  Eyes: Conjunctivae and EOM are normal.  Neck: Normal range of motion.  Cardiovascular: Normal rate and normal heart sounds.   Pulmonary/Chest: Breath sounds normal.  Abdominal: Bowel sounds are normal.  Genitourinary: Prostate normal  and penis normal. Guaiac negative stool.  Musculoskeletal: Normal range of motion. He exhibits no edema and no tenderness.  Neurological: He is alert and oriented to person, place, and time.  Psychiatric: He has a normal mood and affect. His behavior is normal.          Assessment & Plan:   Nonspecific testicular discomfort.  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

## 2014-06-08 NOTE — Telephone Encounter (Signed)
Relevant patient education assigned to patient using Emmi. ° °

## 2014-06-08 NOTE — Progress Notes (Signed)
Pre visit review using our clinic review tool, if applicable. No additional management support is needed unless otherwise documented below in the visit note. 

## 2015-02-26 ENCOUNTER — Other Ambulatory Visit: Payer: Self-pay | Admitting: Internal Medicine

## 2015-08-02 ENCOUNTER — Other Ambulatory Visit: Payer: Self-pay | Admitting: Internal Medicine

## 2015-08-06 ENCOUNTER — Other Ambulatory Visit: Payer: Self-pay | Admitting: Internal Medicine

## 2016-02-06 ENCOUNTER — Ambulatory Visit (INDEPENDENT_AMBULATORY_CARE_PROVIDER_SITE_OTHER): Payer: BLUE CROSS/BLUE SHIELD | Admitting: Physician Assistant

## 2016-02-06 VITALS — BP 110/76 | HR 69 | Temp 98.3°F | Resp 16 | Ht 73.0 in | Wt 212.0 lb

## 2016-02-06 DIAGNOSIS — H9201 Otalgia, right ear: Secondary | ICD-10-CM | POA: Diagnosis not present

## 2016-02-06 DIAGNOSIS — J029 Acute pharyngitis, unspecified: Secondary | ICD-10-CM

## 2016-02-06 LAB — POCT RAPID STREP A (OFFICE): Rapid Strep A Screen: NEGATIVE

## 2016-02-06 NOTE — Patient Instructions (Addendum)
Your strep swab was negative. You likely have either allergies with post nasal drip causing your symptoms or a viral URI causing your symptoms. Taking tylenol daily will help with the pain. Throat lozenges will help.  Taking a decongestant like sudafed will help with the congestion which may help with post nasal drip and sore throat.  Using a humidifier in the room at night should help.   Sore Throat A sore throat is pain, burning, irritation, or scratchiness of the throat. There is often pain or tenderness when swallowing or talking. A sore throat may be accompanied by other symptoms, such as coughing, sneezing, fever, and swollen neck glands. A sore throat is often the first sign of another sickness, such as a cold, flu, strep throat, or mononucleosis (commonly known as mono). Most sore throats go away without medical treatment. CAUSES  The most common causes of a sore throat include:  A viral infection, such as a cold, flu, or mono.  A bacterial infection, such as strep throat, tonsillitis, or whooping cough.  Seasonal allergies.  Dryness in the air.  Irritants, such as smoke or pollution.  Gastroesophageal reflux disease (GERD). HOME CARE INSTRUCTIONS   Only take over-the-counter medicines as directed by your caregiver.  Drink enough fluids to keep your urine clear or pale yellow.  Rest as needed.  Try using throat sprays, lozenges, or sucking on hard candy to ease any pain (if older than 4 years or as directed).  Sip warm liquids, such as broth, herbal tea, or warm water with honey to relieve pain temporarily. You may also eat or drink cold or frozen liquids such as frozen ice pops.  Gargle with salt water (mix 1 tsp salt with 8 oz of water).  Do not smoke and avoid secondhand smoke.  Put a cool-mist humidifier in your bedroom at night to moisten the air. You can also turn on a hot shower and sit in the bathroom with the door closed for 5-10 minutes. SEEK IMMEDIATE MEDICAL  CARE IF:  You have difficulty breathing.  You are unable to swallow fluids, soft foods, or your saliva.  You have increased swelling in the throat.  Your sore throat does not get better in 7 days.  You have nausea and vomiting.  You have a fever or persistent symptoms for more than 2-3 days.  You have a fever and your symptoms suddenly get worse. MAKE SURE YOU:   Understand these instructions.  Will watch your condition.  Will get help right away if you are not doing well or get worse.   This information is not intended to replace advice given to you by your health care provider. Make sure you discuss any questions you have with your health care provider.   Document Released: 11/09/2004 Document Revised: 10/23/2014 Document Reviewed: 06/09/2012 [employer] Interactive Patient Education [employer]2016 [employer]

## 2016-02-06 NOTE — Progress Notes (Signed)
   Subjective:    Patient ID: Lee Holt, male    DOB: 1971-05-13, 45 y.o.   MRN: [id number]  Chief Complaint  Patient presents with  . Sore Throat    for a few weeks  . Ear Pain    right sided   Medications, allergies, past medical history, surgical history, family history, social history and problem list reviewed and updated.  HPI  [age] yom presents with above symptoms.   Head/nasal congestion, allergies past couple weeks with sneezing. Now past few days with persistent ST. Trouble swallowing. Having post nasal drip. Denies cough. Has not taken anything for allergies. Denies fevers, chills. Looked in mirror yesterday and noticed bright red and white spots.   Right ear pain past week. No hearing changes.   Review of Systems No abd pain, n/v, diarrhea.     Objective:   Physical Exam  Constitutional: He appears well-developed and well-nourished.  Non-toxic appearance. He does not have a sickly appearance. He does not appear ill. No distress.  BP 110/76 mmHg  Pulse 69  Temp(Src) 98.3 F (36.8 C)  Resp 16  Ht 6\' 1"  (1.854 m)  Wt 212 lb (96.163 kg)  BMI 27.98 kg/m2  SpO2 98%   HENT:  Right Ear: Tympanic membrane normal. No swelling or tenderness. No mastoid tenderness. Tympanic membrane is not erythematous. No middle ear effusion.  Left Ear: Tympanic membrane normal.  Nose: No mucosal edema or rhinorrhea. Right sinus exhibits no maxillary sinus tenderness and no frontal sinus tenderness. Left sinus exhibits no maxillary sinus tenderness and no frontal sinus tenderness.  Mouth/Throat: Uvula is midline and mucous membranes are normal. Posterior oropharyngeal erythema present. No oropharyngeal exudate.  Neck: No Brudzinski's sign noted.  Pulmonary/Chest: Effort normal and breath sounds normal. No tachypnea.  Lymphadenopathy:       Head (right side): Submandibular adenopathy present. No submental and no tonsillar adenopathy present.       Head (left side): Submandibular  adenopathy present. No submental and no tonsillar adenopathy present.    He has no cervical adenopathy.  Tender submandibular LAN.    Results for orders placed or performed in visit on 02/06/16  POCT rapid strep A  Result Value Ref Range   Rapid Strep A Screen Negative Negative      Assessment & Plan:   Sore throat - Plan: POCT rapid strep A  Otalgia, right --1/4 centor criteria, negative strep swab --suspect allergic rhinitis/viral uri leading to post nasal drip/sore throat --tylenol, fluids, lozenges, sudafed for congestion -- zyrtec/flonae for allergies if symptoms persist   Donnajean Lopesodd M. Fatumata Kashani, PA-C Physician Assistant-Certified [facility]  02/06/2016 1:01 PM

## 2016-02-07 ENCOUNTER — Ambulatory Visit: Payer: Self-pay | Admitting: Internal Medicine

## 2016-02-17 ENCOUNTER — Telehealth: Payer: Self-pay | Admitting: Internal Medicine

## 2016-02-17 NOTE — Telephone Encounter (Signed)
Okay to refill Finasteride 1 mg. Pt is scheduled for CPE 6/23?

## 2016-02-17 NOTE — Telephone Encounter (Signed)
Pt need new Rx for finasteride 1 mg.  Pharm:  [facility] on [address].  Pt has a 6/23 CPE appt.

## 2016-02-18 MED ORDER — FINASTERIDE 1 MG PO TABS: 1.0000 mg | ORAL_TABLET | Freq: Every day | ORAL | Status: DC

## 2016-02-18 NOTE — Telephone Encounter (Signed)
Okay 

## 2016-02-18 NOTE — Telephone Encounter (Signed)
Left message on voicemail Rx sent to pharmacy as requested. 

## 2016-02-22 ENCOUNTER — Encounter: Payer: Self-pay | Admitting: Internal Medicine

## 2016-02-22 ENCOUNTER — Ambulatory Visit (INDEPENDENT_AMBULATORY_CARE_PROVIDER_SITE_OTHER): Payer: BLUE CROSS/BLUE SHIELD | Admitting: Internal Medicine

## 2016-02-22 VITALS — BP 140/80 | HR 66 | Temp 98.3°F | Resp 20 | Ht 73.0 in | Wt 218.0 lb

## 2016-02-22 DIAGNOSIS — J3089 Other allergic rhinitis: Secondary | ICD-10-CM

## 2016-02-22 MED ORDER — ACYCLOVIR 400 MG PO TABS: 400.0000 mg | ORAL_TABLET | Freq: Two times a day (BID) | ORAL | Status: DC

## 2016-02-22 MED ORDER — PREDNISONE 20 MG PO TABS: 20.0000 mg | ORAL_TABLET | Freq: Two times a day (BID) | ORAL | Status: DC

## 2016-02-22 MED ORDER — FINASTERIDE 1 MG PO TABS: 1.0000 mg | ORAL_TABLET | Freq: Every day | ORAL | Status: DC

## 2016-02-22 MED ORDER — FLUTICASONE PROPIONATE 50 MCG/ACT NA SUSP: 1.0000 | Freq: Every day | NASAL | Status: DC

## 2016-02-22 NOTE — Progress Notes (Signed)
Subjective:    Patient ID: Lee Holt, male    DOB: 20-Nov-1970, 45 y.o.   MRN: [id number]  HPI  45 year old patient who presents with symptoms since January or February of this year.  He describes a sense of burning right ear fullness in the throat, associated with postnasal drip and nasal congestion.  Denies any fever.  Symptoms have not been severe, but have been persistent, causing concern.  He was seen in the urgent care on 02/06/2016.  Rapid strep at that time was negative.  He has been using Sudafed only.  He discontinued tobacco products about 2 months ago. \He is scheduled for a physical in about 2 months  This year, he has been sleeping with his elderly dog  Past Medical History  Diagnosis Date  . TENSION HEADACHES, CHRONIC 04/03/2008  . TOBACCO USER 08/05/2009     Social History   Social History  . Marital Status: Single    Spouse Name: N/A  . Number of Children: N/A  . Years of Education: N/A   Occupational History  . Not on file.   Social History Main Topics  . Smoking status: Current Some Day Smoker -- 0.50 packs/day    Types: Cigarettes  . Smokeless tobacco: Never Used  . Alcohol Use: 0.0 oz/week    0 Standard drinks or equivalent per week     Comment: occasionaly  . Drug Use: No  . Sexual Activity: Not on file   Other Topics Concern  . Not on file   Social History Narrative    No past surgical history on file.  Family History  Problem Relation Age of Onset  . Hypertension Mother     No Known Allergies  Current Outpatient Prescriptions on File Prior to Visit  Medication Sig Dispense Refill  . acyclovir (ZOVIRAX) 400 MG tablet Take 1 tablet by mouth every 4 (four) hours while awake . 10 tablet 2  . finasteride (PROPECIA) 1 MG tablet Take 1 tablet (1 mg total) by mouth daily. 90 tablet 0   No current facility-administered medications on file prior to visit.    BP 140/80 mmHg  Pulse 66  Temp(Src) 98.3 F (36.8 C) (Oral)  Resp 20   Ht 6\' 1"  (1.854 m)  Wt 218 lb (98.884 kg)  BMI 28.77 kg/m2  SpO2 98%     Review of Systems  Constitutional: Negative for fever, chills, appetite change and fatigue.  HENT: Positive for congestion, postnasal drip, sinus pressure and sore throat. Negative for dental problem, ear pain, hearing loss, tinnitus, trouble swallowing and voice change.   Eyes: Negative for pain, discharge and visual disturbance.  Respiratory: Negative for cough, chest tightness, wheezing and stridor.   Cardiovascular: Negative for chest pain, palpitations and leg swelling.  Gastrointestinal: Negative for nausea, vomiting, abdominal pain, diarrhea, constipation, blood in stool and abdominal distention.  Genitourinary: Negative for urgency, hematuria, flank pain, discharge, difficulty urinating and genital sores.  Musculoskeletal: Negative for myalgias, back pain, joint swelling, arthralgias, gait problem and neck stiffness.  Skin: Negative for rash.  Neurological: Negative for dizziness, syncope, speech difficulty, weakness, numbness and headaches.  Hematological: Negative for adenopathy. Does not bruise/bleed easily.  Psychiatric/Behavioral: Negative for behavioral problems and dysphoric mood. The patient is not nervous/anxious.    it     Objective:   Physical Exam  Constitutional: He is oriented to person, place, and time. He appears well-developed.  HENT:  Head: Normocephalic.  Right Ear: External ear normal.  Left Ear: External  ear normal.  Very mild injection of the oral pharynx.  Both tympanic membranes were normal  Eyes: Conjunctivae and EOM are normal.  Neck: Normal range of motion.  Cardiovascular: Normal rate and normal heart sounds.   Pulmonary/Chest: Breath sounds normal.  Abdominal: Bowel sounds are normal.  Musculoskeletal: Normal range of motion. He exhibits no edema or tenderness.  Neurological: He is alert and oriented to person, place, and time.  Psychiatric: He has a normal mood and  affect. His behavior is normal.          Assessment & Plan:     Probable allergic rhinitis.   Avoidance measures discussed. Will treat with Zyrtec, Flonase, brief course of oral prednisone  CPX as scheduled

## 2016-02-22 NOTE — Progress Notes (Signed)
Pre visit review using our clinic review tool, if applicable. No additional management support is needed unless otherwise documented below in the visit note. 

## 2016-02-22 NOTE — Patient Instructions (Addendum)
Use a nonsedating antihistamine such as Alavert, Claritin or Zyrtec  Fluticasone nasal spray  Prednisone twice daily as prescribed  HOME CARE INSTRUCTIONS  Drink plenty of water. Water helps thin the mucus so your sinuses can drain more easily.  Use a humidifier.  Inhale steam 3-4 times a day (for example, sit in the bathroom with the shower running).  Apply a warm, moist washcloth to your face 3-4 times a day, or as directed by your health care provider.  Use saline nasal sprays to help moisten and clean your sinuses.

## 2016-03-06 ENCOUNTER — Ambulatory Visit (INDEPENDENT_AMBULATORY_CARE_PROVIDER_SITE_OTHER): Payer: BLUE CROSS/BLUE SHIELD | Admitting: Family Medicine

## 2016-03-06 VITALS — BP 112/77 | HR 95 | Temp 98.0°F | Resp 16 | Ht 71.9 in | Wt 213.4 lb

## 2016-03-06 DIAGNOSIS — R51 Headache: Secondary | ICD-10-CM | POA: Diagnosis not present

## 2016-03-06 DIAGNOSIS — R112 Nausea with vomiting, unspecified: Secondary | ICD-10-CM

## 2016-03-06 DIAGNOSIS — R1084 Generalized abdominal pain: Secondary | ICD-10-CM

## 2016-03-06 DIAGNOSIS — E860 Dehydration: Secondary | ICD-10-CM | POA: Diagnosis not present

## 2016-03-06 DIAGNOSIS — R519 Headache, unspecified: Secondary | ICD-10-CM

## 2016-03-06 LAB — POC MICROSCOPIC URINALYSIS (UMFC): Mucus: ABSENT

## 2016-03-06 LAB — POCT URINALYSIS DIP (MANUAL ENTRY)
Bilirubin, UA: NEGATIVE
Blood, UA: NEGATIVE
Glucose, UA: NEGATIVE
Ketones, POC UA: NEGATIVE
Leukocytes, UA: NEGATIVE
Nitrite, UA: NEGATIVE
Protein Ur, POC: 30 — AB
Spec Grav, UA: 1.015
Urobilinogen, UA: 0.2
pH, UA: 8.5

## 2016-03-06 MED ORDER — PROMETHAZINE HCL 25 MG/ML IJ SOLN
25.0000 mg | Freq: Once | INTRAMUSCULAR | Status: AC
  Administered 2016-03-06: 25 mg via INTRAMUSCULAR

## 2016-03-06 MED ORDER — ONDANSETRON 4 MG PO TBDP
8.0000 mg | ORAL_TABLET | Freq: Once | ORAL | Status: AC
  Administered 2016-03-06: 8 mg via ORAL

## 2016-03-06 MED ORDER — ONDANSETRON 8 MG PO TBDP: 8.0000 mg | ORAL_TABLET | Freq: Three times a day (TID) | ORAL | Status: DC | PRN

## 2016-03-06 MED ORDER — KETOROLAC TROMETHAMINE 30 MG/ML IJ SOLN
30.0000 mg | Freq: Once | INTRAMUSCULAR | Status: AC
  Administered 2016-03-06: 30 mg via INTRAMUSCULAR

## 2016-03-06 NOTE — Progress Notes (Signed)
Subjective:  By signing my name below, I, Lee Holt, attest that this documentation has been prepared under the direction and in the presence of Lee Sorenson, MD. Electronically Signed: Stann Holt, Scribe. 03/06/2016 , 7:00 PM .  Patient was seen in Room 4 .   Patient ID: Lee Holt, male    DOB: 1971-01-31, 45 y.o.   MRN: [id number] Chief Complaint  Patient presents with  . Abdominal Pain  . Nausea  . Emesis  . BODYACHES   HPI Lee Holt is a 45 y.o. male who presents to [facility] complaining of "crampy" abdominal pain with nausea, vomiting and myalgia. Patient states he's been vomiting every 3-4 hours today. He hasn't been able to keep anything down. He's tried taking pepto but he's thrown this up as well. Due to the inability to keep anything down, he notes being "super thirsty". He describes "urine being the color of brass". He notes feeling lightheaded and dizziness, which he believes to be associated with the nausea. He denies past surgical history of abdominal surgeries.   He does mention that vomiting makes him feel better, but the pain creeps back up.   Past Medical History  Diagnosis Date  . TENSION HEADACHES, CHRONIC 04/03/2008  . TOBACCO USER 08/05/2009   Prior to Admission medications   Medication Sig Start Date End Date Taking? Authorizing Provider  acyclovir (ZOVIRAX) 400 MG tablet Take 1 tablet (400 mg total) by mouth 2 (two) times daily. 02/22/16  Yes Gordy Savers, MD  finasteride (PROPECIA) 1 MG tablet Take 1 tablet (1 mg total) by mouth daily. 02/22/16  Yes Gordy Savers, MD   No Known Allergies  No past surgical history on file.  Review of Systems  Constitutional: Positive for activity change, appetite change and fatigue. Negative for fever and chills.  Gastrointestinal: Positive for nausea, vomiting and abdominal pain. Negative for diarrhea, constipation and abdominal distention.  Endocrine: Negative for polydipsia and polyphagia.    Genitourinary: Positive for decreased urine volume. Negative for dysuria, urgency, frequency and difficulty urinating.  Musculoskeletal: Positive for myalgias.  Allergic/Immunologic: Negative for food allergies and immunocompromised state.  Neurological: Positive for dizziness, weakness, light-headedness and headaches. Negative for syncope and speech difficulty.  Hematological: Negative for adenopathy.  Psychiatric/Behavioral: Positive for sleep disturbance.      Objective:   Physical Exam  Constitutional: He is oriented to person, place, and time. He appears well-developed and well-nourished. No distress.  HENT:  Head: Normocephalic and atraumatic.  Eyes: EOM are normal. Pupils are equal, round, and reactive to light.  Neck: Neck supple.  Cardiovascular: Normal rate, regular rhythm, S1 normal, S2 normal and normal heart sounds.   No murmur heard. Pulmonary/Chest: Effort normal. No respiratory distress.  Abdominal: Bowel sounds are decreased. There is generalized tenderness. There is no rebound and no guarding.  Musculoskeletal: Normal range of motion.  Neurological: He is alert and oriented to person, place, and time.  Skin: Skin is warm and dry.  Psychiatric: He has a normal mood and affect. His behavior is normal.  Nursing note and vitals reviewed.   BP 112/77 mmHg  Pulse 95  Temp(Src) 98 F (36.7 C) (Oral)  Resp 16  Ht 5' 11.9" (1.826 m)  Wt 213 lb 6.4 oz (96.798 kg)  BMI 29.03 kg/m2  SpO2 97%   Results for orders placed or performed in visit on 03/06/16  POCT urinalysis dipstick  Result Value Ref Range   Color, UA yellow yellow   Clarity, UA  clear clear   Glucose, UA negative negative   Bilirubin, UA negative negative   Ketones, POC UA negative negative   Spec Grav, UA 1.015    Blood, UA negative negative   pH, UA 8.5    Protein Ur, POC =30 (A) negative   Urobilinogen, UA 0.2    Nitrite, UA Negative Negative   Leukocytes, UA Negative Negative  POCT  Microscopic Urinalysis ([facility])  Result Value Ref Range   WBC,UR,HPF,POC None None WBC/hpf   RBC,UR,HPF,POC Few (A) None RBC/hpf   Bacteria None None, Too numerous to count   Mucus Absent Absent   Epithelial Cells, UR Per Microscopy Few (A) None, Too numerous to count cells/hpf      Assessment & Plan:   1. Non-intractable vomiting with nausea, vomiting of unspecified type   2. Generalized abdominal pain   3. Dehydration   4. Nonintractable headache, unspecified chronicity pattern, unspecified headache type   Suspect food poisoning vs viral.  Exam reassuring. Pt agrees to try of anti-emetics o/n while pushing fluids.  Will RTC in a.m. (12 hrs) if sxs cont or worsen for IVF and labs.  Orders Placed This Encounter  Procedures  . POCT urinalysis dipstick  . POCT Microscopic Urinalysis ([facility])    Meds ordered this encounter  Medications  . ondansetron (ZOFRAN-ODT) disintegrating tablet 8 mg    Sig:   . ondansetron (ZOFRAN-ODT) 8 MG disintegrating tablet    Sig: Take 1 tablet (8 mg total) by mouth every 8 (eight) hours as needed for nausea.    Dispense:  30 tablet    Refill:  0  . promethazine (PHENERGAN) injection 25 mg    Sig:   . ketorolac (TORADOL) 30 MG/ML injection 30 mg    Sig:     I personally performed the services described in this documentation, which was scribed in my presence. The recorded information has been reviewed and considered, and addended by me as needed.  Lee SorensonEva Shaw, MD MPH

## 2016-03-06 NOTE — Patient Instructions (Signed)
Viral Gastroenteritis Viral gastroenteritis is also known as stomach flu. This condition affects the stomach and intestinal tract. It can cause sudden diarrhea and vomiting. The illness typically lasts 3 to 8 days. Most people develop an immune response that eventually gets rid of the virus. While this natural response develops, the virus can make you quite ill. CAUSES  Many different viruses can cause gastroenteritis, such as rotavirus or noroviruses. You can catch one of these viruses by consuming contaminated food or water. You may also catch a virus by sharing utensils or other personal items with an infected person or by touching a contaminated surface. SYMPTOMS  The most common symptoms are diarrhea and vomiting. These problems can cause a severe loss of body fluids (dehydration) and a body salt (electrolyte) imbalance. Other symptoms may include:  Fever.  Headache.  Fatigue.  Abdominal pain. DIAGNOSIS  Your caregiver can usually diagnose viral gastroenteritis based on your symptoms and a physical exam. A stool sample may also be taken to test for the presence of viruses or other infections. TREATMENT  This illness typically goes away on its own. Treatments are aimed at rehydration. The most serious cases of viral gastroenteritis involve vomiting so severely that you are not able to keep fluids down. In these cases, fluids must be given through an intravenous line (IV). HOME CARE INSTRUCTIONS   Drink enough fluids to keep your urine clear or pale yellow. Drink small amounts of fluids frequently and increase the amounts as tolerated.  Ask your caregiver for specific rehydration instructions.  Avoid:  Foods high in sugar.  Alcohol.  Carbonated drinks.  Tobacco.  Juice.  Caffeine drinks.  Extremely hot or cold fluids.  Fatty, greasy foods.  Too much intake of anything at one time.  Dairy products until 24 to 48 hours after diarrhea stops.  You may consume probiotics.  Probiotics are active cultures of beneficial bacteria. They may lessen the amount and number of diarrheal stools in adults. Probiotics can be found in yogurt with active cultures and in supplements.  Wash your hands well to avoid spreading the virus.  Only take over-the-counter or prescription medicines for pain, discomfort, or fever as directed by your caregiver. Do not give aspirin to children. Antidiarrheal medicines are not recommended.  Ask your caregiver if you should continue to take your regular prescribed and over-the-counter medicines.  Keep all follow-up appointments as directed by your caregiver. SEEK IMMEDIATE MEDICAL CARE IF:   You are unable to keep fluids down.  You do not urinate at least once every 6 to 8 hours.  You develop shortness of breath.  You notice blood in your stool or vomit. This may look like coffee grounds.  You have abdominal pain that increases or is concentrated in one small area (localized).  You have persistent vomiting or diarrhea.  You have a fever.  The patient is a child younger than 3 months, and he or she has a fever.  The patient is a child older than 3 months, and he or she has a fever and persistent symptoms.  The patient is a child older than 3 months, and he or she has a fever and symptoms suddenly get worse.  The patient is a baby, and he or she has no tears when crying. MAKE SURE YOU:   Understand these instructions.  Will watch your condition.  Will get help right away if you are not doing well or get worse.   This information is not intended to replace  advice given to you by your health care provider. Make sure you discuss any questions you have with your health care provider.   Document Released: 10/02/2005 Document Revised: 12/25/2011 Document Reviewed: 07/19/2011 Elsevier Interactive Patient Education 2016 [employer] Inc[employer] Dehydration, Adult Dehydration is a condition in which you do not have enough fluid or water in  your body. It happens when you take in less fluid than you lose. Vital organs such as the kidneys, brain, and heart cannot function without a proper amount of fluids. Any loss of fluids from the body can cause dehydration.  Dehydration can range from mild to severe. This condition should be treated right away to help prevent it from becoming severe. CAUSES  This condition may be caused by:  Vomiting.  Diarrhea.  Excessive sweating, such as when exercising in hot or humid weather.  Not drinking enough fluid during strenuous exercise or during an illness.  Excessive urine output.  Fever.  Certain medicines. RISK FACTORS This condition is more likely to develop in:  People who are taking certain medicines that cause the body to lose excess fluid (diuretics).   People who have a chronic illness, such as diabetes, that may increase urination.  Older adults.   People who live at high altitudes.   People who participate in endurance sports.  SYMPTOMS  Mild Dehydration  Thirst.  Dry lips.  Slightly dry mouth.  Dry, warm skin. Moderate Dehydration  Very dry mouth.   Muscle cramps.   Dark urine and decreased urine production.   Decreased tear production.   Headache.   Light-headedness, especially when you stand up from a sitting position.  Severe Dehydration  Changes in skin.   Cold and clammy skin.   Skin does not spring back quickly when lightly pinched and released.   Changes in body fluids.   Extreme thirst.   No tears.   Not able to sweat when body temperature is high, such as in hot weather.   Minimal urine production.   Changes in vital signs.   Rapid, weak pulse (more than 100 beats per minute when you are sitting still).   Rapid breathing.   Low blood pressure.   Other changes.   Sunken eyes.   Cold hands and feet.   Confusion.  Lethargy and difficulty being awakened.  Fainting (syncope).   Short-term  weight loss.   Unconsciousness. DIAGNOSIS  This condition may be diagnosed based on your symptoms. You may also have tests to determine how severe your dehydration is. These tests may include:   Urine tests.   Blood tests.  TREATMENT  Treatment for this condition depends on the severity. Mild or moderate dehydration can often be treated at home. Treatment should be started right away. Do not wait until dehydration becomes severe. Severe dehydration needs to be treated at the hospital. Treatment for Mild Dehydration  Drinking plenty of water to replace the fluid you have lost.   Replacing minerals in your blood (electrolytes) that you may have lost.  Treatment for Moderate Dehydration  Consuming oral rehydration solution (ORS). Treatment for Severe Dehydration  Receiving fluid through an IV tube.   Receiving electrolyte solution through a feeding tube that is passed through your nose and into your stomach (nasogastric tube or NG tube).  Correcting any abnormalities in electrolytes. HOME CARE INSTRUCTIONS   Drink enough fluid to keep your urine clear or pale yellow.   Drink water or fluid slowly by taking small sips. You can also try sucking on  ice cubes.  Have food or beverages that contain electrolytes. Examples include bananas and sports drinks.  Take over-the-counter and prescription medicines only as told by your health care provider.   Prepare ORS according to the manufacturer's instructions. Take sips of ORS every 5 minutes until your urine returns to normal.  If you have vomiting or diarrhea, continue to try to drink water, ORS, or both.   If you have diarrhea, avoid:   Beverages that contain caffeine.   Fruit juice.   Milk.   Carbonated soft drinks.  Do not take salt tablets. This can lead to the condition of having too much sodium in your body (hypernatremia).  SEEK MEDICAL CARE IF:  You cannot eat or drink without vomiting.  You have  had moderate diarrhea during a period of more than 24 hours.  You have a fever. SEEK IMMEDIATE MEDICAL CARE IF:   You have extreme thirst.  You have severe diarrhea.  You have not urinated in 6-8 hours, or you have urinated only a small amount of very dark urine.  You have shriveled skin.  You are dizzy, confused, or both.   This information is not intended to replace advice given to you by your health care provider. Make sure you discuss any questions you have with your health care provider.   Document Released: 10/02/2005 Document Revised: 06/23/2015 Document Reviewed: 02/17/2015 Elsevier Interactive Patient Education [employer]2016 [employer] Inc[employer]

## 2016-03-31 ENCOUNTER — Other Ambulatory Visit (INDEPENDENT_AMBULATORY_CARE_PROVIDER_SITE_OTHER): Payer: BLUE CROSS/BLUE SHIELD

## 2016-03-31 DIAGNOSIS — Z0000 Encounter for general adult medical examination without abnormal findings: Secondary | ICD-10-CM

## 2016-03-31 LAB — HEPATIC FUNCTION PANEL
ALT: 19 U/L (ref 0–53)
AST: 21 U/L (ref 0–37)
Albumin: 4.3 g/dL (ref 3.5–5.2)
Alkaline Phosphatase: 58 U/L (ref 39–117)
Bilirubin, Direct: 0 mg/dL (ref 0.0–0.3)
Total Bilirubin: 0.4 mg/dL (ref 0.2–1.2)
Total Protein: 7.3 g/dL (ref 6.0–8.3)

## 2016-03-31 LAB — CBC WITH DIFFERENTIAL/PLATELET
Basophils Absolute: 0.1 10*3/uL (ref 0.0–0.1)
Basophils Relative: 1.1 % (ref 0.0–3.0)
Eosinophils Absolute: 0 10*3/uL (ref 0.0–0.7)
Eosinophils Relative: 0 % (ref 0.0–5.0)
HCT: 45.8 % (ref 39.0–52.0)
Hemoglobin: 15.6 g/dL (ref 13.0–17.0)
Lymphocytes Relative: 50.7 % — ABNORMAL HIGH (ref 12.0–46.0)
Lymphs Abs: 4.1 10*3/uL — ABNORMAL HIGH (ref 0.7–4.0)
MCHC: 34.2 g/dL (ref 30.0–36.0)
MCV: 93.7 fl (ref 78.0–100.0)
Monocytes Absolute: 0.9 10*3/uL (ref 0.1–1.0)
Monocytes Relative: 10.7 % (ref 3.0–12.0)
Neutro Abs: 3 10*3/uL (ref 1.4–7.7)
Neutrophils Relative %: 37.5 % — ABNORMAL LOW (ref 43.0–77.0)
Platelets: 309 10*3/uL (ref 150.0–400.0)
RBC: 4.89 Mil/uL (ref 4.22–5.81)
RDW: 13.6 % (ref 11.5–15.5)
WBC: 8 10*3/uL (ref 4.0–10.5)

## 2016-03-31 LAB — POC URINALSYSI DIPSTICK (AUTOMATED)
Bilirubin, UA: NEGATIVE
Glucose, UA: NEGATIVE
Ketones, UA: NEGATIVE
Leukocytes, UA: NEGATIVE
Nitrite, UA: NEGATIVE
Protein, UA: NEGATIVE
Spec Grav, UA: 1.025
Urobilinogen, UA: 0.2
pH, UA: 5.5

## 2016-03-31 LAB — LIPID PANEL
Cholesterol: 192 mg/dL (ref 0–200)
HDL: 42.4 mg/dL (ref >39.00)
LDL Cholesterol: 125 mg/dL — ABNORMAL HIGH (ref 0–99)
NonHDL: 149.1
Total CHOL/HDL Ratio: 5
Triglycerides: 121 mg/dL (ref 0.0–149.0)
VLDL: 24.2 mg/dL (ref 0.0–40.0)

## 2016-03-31 LAB — BASIC METABOLIC PANEL
BUN: 17 mg/dL (ref 6–23)
CO2: 26 mEq/L (ref 19–32)
Calcium: 9.3 mg/dL (ref 8.4–10.5)
Chloride: 104 mEq/L (ref 96–112)
Creatinine, Ser: 1.13 mg/dL (ref 0.40–1.50)
GFR: 73.83 mL/min (ref >60.00)
Glucose, Bld: 100 mg/dL — ABNORMAL HIGH (ref 70–99)
Potassium: 4.1 mEq/L (ref 3.5–5.1)
Sodium: 138 mEq/L (ref 135–145)

## 2016-03-31 LAB — TSH: TSH: 1.94 u[IU]/mL (ref 0.35–4.50)

## 2016-04-07 ENCOUNTER — Ambulatory Visit (INDEPENDENT_AMBULATORY_CARE_PROVIDER_SITE_OTHER): Payer: BLUE CROSS/BLUE SHIELD | Admitting: Internal Medicine

## 2016-04-07 ENCOUNTER — Encounter: Payer: Self-pay | Admitting: Internal Medicine

## 2016-04-07 VITALS — BP 130/86 | HR 73 | Temp 98.3°F | Resp 18 | Ht 72.0 in | Wt 216.0 lb

## 2016-04-07 DIAGNOSIS — F17200 Nicotine dependence, unspecified, uncomplicated: Secondary | ICD-10-CM | POA: Diagnosis not present

## 2016-04-07 DIAGNOSIS — M25561 Pain in right knee: Secondary | ICD-10-CM

## 2016-04-07 DIAGNOSIS — Z0000 Encounter for general adult medical examination without abnormal findings: Secondary | ICD-10-CM | POA: Diagnosis not present

## 2016-04-07 MED ORDER — FINASTERIDE 1 MG PO TABS: 1.0000 mg | ORAL_TABLET | Freq: Every day | ORAL | Status: DC

## 2016-04-07 NOTE — Progress Notes (Signed)
Subjective:    Patient ID: Lee Holt, male    DOB: 22-Feb-1971, 45 y.o.   MRN: [id number]  HPI   45 year old patient who is seen today for a preventive health examination.  He enjoys excellent health.  He did discontinue tobacco products.  A proximally 6 weeks ago. His only complaint is fairly chronic right lateral knee pain.  He is requesting orthopedic referral.  He has been seen by sports medicine in the past and has failed conservative care with exercises and stretching.  Family history reviewed.  Father age 47 history of CABG and hypertension.  Mother age 47 in good health.  2 sisters, one with a history of bipolar disorder  Past Medical History  Diagnosis Date  . TENSION HEADACHES, CHRONIC 04/03/2008  . TOBACCO USER 08/05/2009     Social History   Social History  . Marital Status: Single    Spouse Name: N/A  . Number of Children: N/A  . Years of Education: N/A   Occupational History  . Not on file.   Social History Main Topics  . Smoking status: Former Smoker -- 0.50 packs/day    Types: Cigarettes    Quit date: 02/25/2016  . Smokeless tobacco: Never Used  . Alcohol Use: 0.0 oz/week    0 Standard drinks or equivalent per week     Comment: occasionaly  . Drug Use: No  . Sexual Activity: Not on file   Other Topics Concern  . Not on file   Social History Narrative    No past surgical history on file.  Family History  Problem Relation Age of Onset  . Hypertension Mother     No Known Allergies  Current Outpatient Prescriptions on File Prior to Visit  Medication Sig Dispense Refill  . acyclovir (ZOVIRAX) 400 MG tablet Take 1 tablet (400 mg total) by mouth 2 (two) times daily. 20 tablet 2  . finasteride (PROPECIA) 1 MG tablet Take 1 tablet (1 mg total) by mouth daily. 90 tablet 0  . ondansetron (ZOFRAN-ODT) 8 MG disintegrating tablet Take 1 tablet (8 mg total) by mouth every 8 (eight) hours as needed for nausea. 30 tablet 0   No current  facility-administered medications on file prior to visit.    BP 130/86 mmHg  Pulse 73  Temp(Src) 98.3 F (36.8 C) (Oral)  Resp 18  Ht 6' (1.829 m)  Wt 216 lb (97.977 kg)  BMI 29.29 kg/m2  SpO2 98%    Review of Systems  Constitutional: Negative for fever, chills, appetite change and fatigue.  HENT: Negative for congestion, dental problem, ear pain, hearing loss, sore throat, tinnitus, trouble swallowing and voice change.   Eyes: Negative for pain, discharge and visual disturbance.  Respiratory: Negative for cough, chest tightness, wheezing and stridor.   Cardiovascular: Negative for chest pain, palpitations and leg swelling.  Gastrointestinal: Negative for nausea, vomiting, abdominal pain, diarrhea, constipation, blood in stool and abdominal distention.  Genitourinary: Negative for urgency, hematuria, flank pain, discharge, difficulty urinating and genital sores.  Musculoskeletal: Negative for myalgias, back pain, joint swelling, arthralgias, gait problem and neck stiffness.       Right lateral knee pain  Skin: Negative for rash.  Neurological: Negative for dizziness, syncope, speech difficulty, weakness, numbness and headaches.  Hematological: Negative for adenopathy. Does not bruise/bleed easily.  Psychiatric/Behavioral: Negative for behavioral problems and dysphoric mood. The patient is not nervous/anxious.        Objective:   Physical Exam  Constitutional: He appears well-developed and  well-nourished.  HENT:  Head: Normocephalic and atraumatic.  Right Ear: External ear normal.  Left Ear: External ear normal.  Nose: Nose normal.  Mouth/Throat: Oropharynx is clear and moist.  Eyes: Conjunctivae and EOM are normal. Pupils are equal, round, and reactive to light. No scleral icterus.  Neck: Normal range of motion. Neck supple. No JVD present. No thyromegaly present.  Cardiovascular: Regular rhythm, normal heart sounds and intact distal pulses.  Exam reveals no gallop and no  friction rub.   No murmur heard. Pulmonary/Chest: Effort normal and breath sounds normal. He exhibits no tenderness.  Abdominal: Soft. Bowel sounds are normal. He exhibits no distension and no mass. There is no tenderness.  Genitourinary: Rectum normal, prostate normal and penis normal. Guaiac negative stool.  Musculoskeletal: Normal range of motion. He exhibits no edema or tenderness.  Mild tenderness of right lateral knee  Lymphadenopathy:    He has no cervical adenopathy.  Neurological: He is alert. He has normal reflexes. No cranial nerve deficit. Coordination normal.  Skin: Skin is warm and dry. No rash noted.  Psychiatric: He has a normal mood and affect. His behavior is normal.          Assessment & Plan:    Preventive health examination.  Laboratory studies reviewed Chronic right lateral knee pain.  Will refer to orthopedics.  Per patient request  Modest weight loss, regular exercise.  All encouraged Recheck one year or as needed

## 2016-04-07 NOTE — Patient Instructions (Signed)

## 2016-04-07 NOTE — Progress Notes (Signed)
Pre visit review using our clinic review tool, if applicable. No additional management support is needed unless otherwise documented below in the visit note. 

## 2016-04-12 DIAGNOSIS — M1711 Unilateral primary osteoarthritis, right knee: Secondary | ICD-10-CM | POA: Diagnosis not present

## 2016-09-05 ENCOUNTER — Encounter: Payer: Self-pay | Admitting: Adult Health

## 2016-09-05 ENCOUNTER — Ambulatory Visit (INDEPENDENT_AMBULATORY_CARE_PROVIDER_SITE_OTHER): Payer: BLUE CROSS/BLUE SHIELD | Admitting: Adult Health

## 2016-09-05 VITALS — BP 128/70 | Ht 72.0 in | Wt 224.2 lb

## 2016-09-05 DIAGNOSIS — R1011 Right upper quadrant pain: Secondary | ICD-10-CM

## 2016-09-05 LAB — CBC WITH DIFFERENTIAL/PLATELET
Basophils Absolute: 0.1 10*3/uL (ref 0.0–0.1)
Basophils Relative: 0.7 % (ref 0.0–3.0)
Eosinophils Absolute: 0.1 10*3/uL (ref 0.0–0.7)
Eosinophils Relative: 0.9 % (ref 0.0–5.0)
HCT: 48.6 % (ref 39.0–52.0)
Hemoglobin: 16.7 g/dL (ref 13.0–17.0)
Lymphocytes Relative: 45.1 % (ref 12.0–46.0)
Lymphs Abs: 4.2 10*3/uL — ABNORMAL HIGH (ref 0.7–4.0)
MCHC: 34.3 g/dL (ref 30.0–36.0)
MCV: 93.8 fl (ref 78.0–100.0)
Monocytes Absolute: 1.2 10*3/uL — ABNORMAL HIGH (ref 0.1–1.0)
Monocytes Relative: 12.8 % — ABNORMAL HIGH (ref 3.0–12.0)
Neutro Abs: 3.7 10*3/uL (ref 1.4–7.7)
Neutrophils Relative %: 40.5 % — ABNORMAL LOW (ref 43.0–77.0)
Platelets: 280 10*3/uL (ref 150.0–400.0)
RBC: 5.19 Mil/uL (ref 4.22–5.81)
RDW: 13.8 % (ref 11.5–15.5)
WBC: 9.2 10*3/uL (ref 4.0–10.5)

## 2016-09-05 LAB — POC URINALSYSI DIPSTICK (AUTOMATED)
Bilirubin, UA: NEGATIVE
Blood, UA: NEGATIVE
Glucose, UA: NEGATIVE
Ketones, UA: NEGATIVE
Leukocytes, UA: NEGATIVE
Nitrite, UA: NEGATIVE
Protein, UA: NEGATIVE
Spec Grav, UA: 1.015
Urobilinogen, UA: 0.2
pH, UA: 5

## 2016-09-05 LAB — COMPREHENSIVE METABOLIC PANEL
ALT: 16 U/L (ref 0–53)
AST: 19 U/L (ref 0–37)
Albumin: 4.5 g/dL (ref 3.5–5.2)
Alkaline Phosphatase: 57 U/L (ref 39–117)
BUN: 17 mg/dL (ref 6–23)
CO2: 29 mEq/L (ref 19–32)
Calcium: 9.4 mg/dL (ref 8.4–10.5)
Chloride: 103 mEq/L (ref 96–112)
Creatinine, Ser: 1.05 mg/dL (ref 0.40–1.50)
GFR: 80.21 mL/min (ref >60.00)
Glucose, Bld: 90 mg/dL (ref 70–99)
Potassium: 4.8 mEq/L (ref 3.5–5.1)
Sodium: 138 mEq/L (ref 135–145)
Total Bilirubin: 0.4 mg/dL (ref 0.2–1.2)
Total Protein: 7.3 g/dL (ref 6.0–8.3)

## 2016-09-05 LAB — LIPASE: Lipase: 57 U/L (ref 11.0–59.0)

## 2016-09-05 NOTE — Progress Notes (Signed)
Subjective:    Patient ID: Lee Holt, male    DOB: [birth year], 45 y.o.   MRN: [id number]  Abdominal Pain  This is a new problem. The current episode started 1 to 4 weeks ago (2 weeks ). The onset quality is undetermined. The problem occurs constantly. The problem has been unchanged. The pain is located in the RUQ. The quality of the pain is aching and sharp. The abdominal pain radiates to the back and right flank (between shoulder blades). Associated symptoms include belching, diarrhea, flatus and nausea. Pertinent negatives include no myalgias or vomiting. The pain is aggravated by movement, coughing and deep breathing. The pain is relieved by nothing. Prior diagnostic workup includes ultrasound. There is no history of abdominal surgery, gallstones or pancreatitis.    Has had occasional burning with urination, especially at night. Does not eat a lot of fast food or fried foods. He has not noticed that the pain is worse before or after eating.   Review of Systems  Constitutional: Negative.   Respiratory: Negative.   Cardiovascular: Negative.   Gastrointestinal: Positive for abdominal pain, diarrhea, flatus and nausea. Negative for abdominal distention, anal bleeding, blood in stool and vomiting.  Musculoskeletal: Negative for myalgias.  All other systems reviewed and are negative.  Past Medical History:  Diagnosis Date  . TENSION HEADACHES, CHRONIC 04/03/2008  . TOBACCO USER 08/05/2009    Social History   Social History  . Marital status: Single    Spouse name: N/A  . Number of children: N/A  . Years of education: N/A   Occupational History  . Not on file.   Social History Main Topics  . Smoking status: Former Smoker    Packs/day: 0.50    Types: Cigarettes    Quit date: 02/25/2016  . Smokeless tobacco: Never Used  . Alcohol use 0.0 oz/week     Comment: occasionaly  . Drug use: No  . Sexual activity: Not on file   Other Topics Concern  . Not on file   Social  History Narrative  . No narrative on file    No past surgical history on file.  Family History  Problem Relation Age of Onset  . Hypertension Mother     No Known Allergies  Current Outpatient Prescriptions on File Prior to Visit  Medication Sig Dispense Refill  . acyclovir (ZOVIRAX) 400 MG tablet Take 1 tablet (400 mg total) by mouth 2 (two) times daily. 20 tablet 2  . finasteride (PROPECIA) 1 MG tablet Take 1 tablet (1 mg total) by mouth daily. 90 tablet 3  . ondansetron (ZOFRAN-ODT) 8 MG disintegrating tablet Take 1 tablet (8 mg total) by mouth every 8 (eight) hours as needed for nausea. 30 tablet 0   No current facility-administered medications on file prior to visit.     BP 128/70   Ht 6' (1.829 m)   Wt 224 lb 3.2 oz (101.7 kg)   BMI 30.41 kg/m       Objective:   Physical Exam  Constitutional: He is oriented to person, place, and time. He appears well-developed and well-nourished. No distress.  Cardiovascular: Normal rate, regular rhythm and intact distal pulses.  Exam reveals no gallop and no friction rub.   No murmur heard. Pulmonary/Chest: Breath sounds normal. No respiratory distress. He has no wheezes. He has no rales. He exhibits no tenderness.  Abdominal: Soft. Bowel sounds are normal. He exhibits distension. He exhibits no mass. There is no hepatosplenomegaly, splenomegaly or hepatomegaly. There is  tenderness in the right upper quadrant, epigastric area and periumbilical area. There is no rigidity, no rebound, no guarding, no CVA tenderness, no tenderness at McBurney's point and negative Murphy's sign.  Musculoskeletal: Normal range of motion.  Neurological: He is alert and oriented to person, place, and time.  Skin: Skin is warm and dry. No rash noted. He is not diaphoretic. No erythema. No pallor.  Psychiatric: He has a normal mood and affect. His behavior is normal. Judgment and thought content normal.  Nursing note and vitals reviewed.     Assessment &  Plan:  1. Abdominal pain, right upper quadrant - CBC with Differential/Platelet - Comprehensive metabolic panel - Lipase - US Abdomen Limited RUQ; Future - POCT Urinalysis Dipstick (Automated) - Follow up if no immprovement  Shirline Freesory Tanaisha Pittman, NP

## 2016-09-05 NOTE — Patient Instructions (Signed)
It was great meeting you today  Stop by the lab on your way out and then I will follow up with you tomorrow about your lab work and ultrasound.   If the pain gets worse,please go to the ER

## 2016-09-06 ENCOUNTER — Ambulatory Visit
Admission: RE | Admit: 2016-09-06 | Discharge: 2016-09-06 | Disposition: A | Discharge status: Home / Self Care | Payer: BLUE CROSS/BLUE SHIELD | Source: Ambulatory Visit | Attending: Adult Health | Admitting: Adult Health

## 2016-09-06 ENCOUNTER — Telehealth: Payer: Self-pay | Admitting: Adult Health

## 2016-09-06 DIAGNOSIS — R1011 Right upper quadrant pain: Secondary | ICD-10-CM

## 2016-09-06 MED ORDER — PANTOPRAZOLE SODIUM 40 MG PO TBEC: 40.0000 mg | DELAYED_RELEASE_TABLET | Freq: Every day | ORAL | 0 refills | Status: DC

## 2016-09-06 NOTE — Telephone Encounter (Signed)
Spoke to patient and informed him of his labs and ultrasound. Both are negative.   He advised me that he had a gluten free wrap last night and it gave him diarrhea. He continues to have RUQ pain.   Will treat as possible peptic ulcer and prescribe Protonix for 4 weeks.

## 2016-09-22 DIAGNOSIS — L570 Actinic keratosis: Secondary | ICD-10-CM | POA: Diagnosis not present

## 2016-09-22 DIAGNOSIS — B353 Tinea pedis: Secondary | ICD-10-CM | POA: Diagnosis not present

## 2016-09-22 DIAGNOSIS — L814 Other melanin hyperpigmentation: Secondary | ICD-10-CM | POA: Diagnosis not present

## 2016-09-22 DIAGNOSIS — D225 Melanocytic nevi of trunk: Secondary | ICD-10-CM | POA: Diagnosis not present

## 2016-09-22 DIAGNOSIS — L821 Other seborrheic keratosis: Secondary | ICD-10-CM | POA: Diagnosis not present

## 2016-09-25 ENCOUNTER — Ambulatory Visit (INDEPENDENT_AMBULATORY_CARE_PROVIDER_SITE_OTHER): Payer: BLUE CROSS/BLUE SHIELD | Admitting: Internal Medicine

## 2016-09-25 ENCOUNTER — Encounter: Payer: Self-pay | Admitting: Internal Medicine

## 2016-09-25 VITALS — BP 128/76 | HR 61 | Temp 98.0°F | Ht 72.0 in | Wt <= 1120 oz

## 2016-09-25 DIAGNOSIS — G8929 Other chronic pain: Secondary | ICD-10-CM

## 2016-09-25 DIAGNOSIS — R1011 Right upper quadrant pain: Secondary | ICD-10-CM | POA: Diagnosis not present

## 2016-09-25 NOTE — Progress Notes (Signed)
Subjective:    Patient ID: Lee Holt, male    DOB: March 27, 1971, 45 y.o.   MRN: [id number]  HPI  45 year old patient who is seen today in follow-up. He has a greater than six-month history of right upper quadrant discomfort. He has had a recent limited right upper quadrant ultrasound that was negative for gallbladder pathology.  He continues to have right upper quadrant discomfort that he describes as fairly mild and occurring daily.  No associated nausea.  No severe pain.  Recent laboratory screen including LFTs unremarkable He has given himself a trial of a lactose free and gluten-free diet without improvement. Symptoms do seem aggravated postprandially  More recently, describes a irregular bowel habits.  He states the pain does occasionally radiate to the right shoulder area  Past Medical History:  Diagnosis Date  . TENSION HEADACHES, CHRONIC 04/03/2008  . TOBACCO USER 08/05/2009     Social History   Social History  . Marital status: Single    Spouse name: N/A  . Number of children: N/A  . Years of education: N/A   Occupational History  . Not on file.   Social History Main Topics  . Smoking status: Former Smoker    Packs/day: 0.50    Types: Cigarettes    Quit date: 02/25/2016  . Smokeless tobacco: Never Used  . Alcohol use 0.0 oz/week     Comment: occasionaly  . Drug use: No  . Sexual activity: Not on file   Other Topics Concern  . Not on file   Social History Narrative  . No narrative on file    No past surgical history on file.  Family History  Problem Relation Age of Onset  . Hypertension Mother     No Known Allergies  Current Outpatient Prescriptions on File Prior to Visit  Medication Sig Dispense Refill  . acyclovir (ZOVIRAX) 400 MG tablet Take 1 tablet (400 mg total) by mouth 2 (two) times daily. 20 tablet 2  . finasteride (PROPECIA) 1 MG tablet Take 1 tablet (1 mg total) by mouth daily. 90 tablet 3  . ondansetron (ZOFRAN-ODT) 8 MG  disintegrating tablet Take 1 tablet (8 mg total) by mouth every 8 (eight) hours as needed for nausea. 30 tablet 0  . pantoprazole (PROTONIX) 40 MG tablet Take 1 tablet (40 mg total) by mouth daily. 30 tablet 0   No current facility-administered medications on file prior to visit.     BP 128/76 (BP Location: Left Arm, Patient Position: Sitting, Cuff Size: Normal)   Pulse 61   Temp 98 F (36.7 C) (Oral)   Ht 6' (1.829 m)   Wt 22 lb (9.979 kg)   SpO2 97%   BMI 2.98 kg/m     Review of Systems  Constitutional: Negative for appetite change, chills, fatigue and fever.  HENT: Negative for congestion, dental problem, ear pain, hearing loss, sore throat, tinnitus, trouble swallowing and voice change.   Eyes: Negative for pain, discharge and visual disturbance.  Respiratory: Negative for cough, chest tightness, wheezing and stridor.   Cardiovascular: Negative for chest pain, palpitations and leg swelling.  Gastrointestinal: Positive for abdominal pain and diarrhea. Negative for abdominal distention, blood in stool, constipation, nausea and vomiting.  Genitourinary: Negative for difficulty urinating, discharge, flank pain, genital sores, hematuria and urgency.  Musculoskeletal: Negative for arthralgias, back pain, gait problem, joint swelling, myalgias and neck stiffness.  Skin: Negative for rash.  Neurological: Negative for dizziness, syncope, speech difficulty, weakness, numbness and headaches.  Hematological:  Negative for adenopathy. Does not bruise/bleed easily.  Psychiatric/Behavioral: Negative for behavioral problems and dysphoric mood. The patient is not nervous/anxious.        Objective:   Physical Exam  Constitutional: He is oriented to person, place, and time. He appears well-developed.  HENT:  Head: Normocephalic.  Right Ear: External ear normal.  Left Ear: External ear normal.  Eyes: Conjunctivae and EOM are normal.  Neck: Normal range of motion.  Cardiovascular: Normal  rate and normal heart sounds.   Pulmonary/Chest: Breath sounds normal.  Abdominal: Soft. Bowel sounds are normal. He exhibits no distension and no mass. There is no tenderness. There is no rebound and no guarding.  Musculoskeletal: Normal range of motion. He exhibits no edema or tenderness.  Neurological: He is alert and oriented to person, place, and time.  Psychiatric: He has a normal mood and affect. His behavior is normal.          Assessment & Plan:   Chronic right upper quadrant discomfort.  After lengthy discussion with the patient, the pain seems very mild and he is more concerned about serious pathology.  Options discussed.  Will proceed with abdominal CT scan to rule out serious pathology.  I believe the likelihood of Significant pathology is low, but he wishes to proceed.  The possibility of IBS.  Discussed  Rogelia BogaKWIATKOWSKI,Jazlyne Gauger FRANK

## 2016-09-25 NOTE — Progress Notes (Signed)
Pre visit review using our clinic review tool, if applicable. No additional management support is needed unless otherwise documented below in the visit note. 

## 2016-09-25 NOTE — Patient Instructions (Addendum)
Biliary Colic, Adult °Biliary colic is severe pain caused by a problem with a small organ in the upper right part of your belly (gallbladder). The gallbladder stores a digestive fluid produced in the liver (bile) that helps the body break down fat. Bile and other digestive enzymes are carried from the liver to the small intestine though tube-like structures (bile ducts). The gallbladder and the bile ducts form the biliary tract. °Sometimes hard deposits of digestive fluids form in the gallbladder (gallstones) and block the flow of bile from the gallbladder, causing biliary colic. This condition is also called a gallbladder attack. Gallstones can be as small as a grain of sand or as big as a golf ball. There could be just one gallstone in the gallbladder, or there could be many. °What are the causes? °Biliary colic is usually caused by gallstones. Less often, a tumor could block the flow of bile from the gallbladder and trigger biliary colic. °What increases the risk? °This condition is more likely to develop in: °· Women. °· People of Hispanic descent. °· People with a family history of gallstones. °· People who are obese. °· People who suddenly or quickly lose weight. °· People who eat a high-calorie, low-fiber diet that is rich in refined carbs (carbohydrates), such as white bread and white rice. °· People who have an intestinal disease that affects nutrient absorption, such as Crohn disease. °· People who have a metabolic condition, such as metabolic syndrome or diabetes. °What are the signs or symptoms? °Severe pain in the upper right side of the belly is the main symptom of biliary colic. You may feel this pain below the chest but above the hip. This pain often occurs at night or after eating a very fatty meal. This pain may get worse for up to an hour and last as long as 12 hours. In most cases, the pain fades (subsides) within a couple hours. °Other symptoms of this condition include: °· Nausea and  vomiting. °· Pain under the right shoulder. °How is this diagnosed? °This condition is diagnosed based on your medical history, your symptoms, and a physical exam. You may have tests, including: °· Blood tests to rule out infection or inflammation of the bile ducts, gallbladder, pancreas, or liver. °· Imaging studies such as: °¨ Ultrasound. °¨ CT scan. °¨ MRI. °In some cases, you may need to have an imaging study done using a small amount of radioactive material (nuclear medicine) to confirm the diagnosis. °How is this treated? °Treatment for this condition may include medicine to relieve your pain or nausea. If you have gallstones that are causing biliary colic, you may need surgery to remove the gallbladder (cholecystectomy). Gallstones can also be dissolved gradually with medicine. It may take months or years before the gallstones are completely gone. °Follow these instructions at home: °· Take over-the-counter and prescription medicines only as told by your health care provider. °· Drink enough fluid to keep your urine clear or pale yellow. °· Follow instructions from your health care provider about eating or drinking restrictions. These may include avoiding: °¨ Fatty, greasy, and fried foods. °¨ Any foods that make the pain worse. °¨ Overeating. °¨ Having a large meal after not eating for a while. °· Keep all follow-up visits as told by your health care provider. This is important. °How is this prevented? °Steps to prevent this condition include: °· Maintaining a healthy body weight. °· Getting regular exercise. °· Eating a healthy, high-fiber, low-fat diet. °· Limiting how much sugar   and refined carbs you eat, such as sweets, white flour, and white rice. Contact a health care provider if:  Your pain lasts more than 5 hours.  You vomit.  You have a fever and chills.  Your pain gets worse. Get help right away if:  Your skin or the whites of your eyes look yellow (jaundice).  Your have tea-colored  urine and light-colored stools.  You are dizzy or you faint. This information is not intended to replace advice given to you by your health care provider. Make sure you discuss any questions you have with your health care provider. Document Released: 03/05/2006 Document Revised: 05/30/2016 Document Reviewed: 04/17/2016 Elsevier Interactive Patient Education  2017 [employer] [place]\\Call or return to clinic prn if these symptoms worsen or fail to improve as anticipated.

## 2016-10-31 ENCOUNTER — Ambulatory Visit
Admission: RE | Admit: 2016-10-31 | Discharge: 2016-10-31 | Disposition: A | Discharge status: Home / Self Care | Payer: BLUE CROSS/BLUE SHIELD | Source: Ambulatory Visit | Attending: Internal Medicine | Admitting: Internal Medicine

## 2016-10-31 ENCOUNTER — Other Ambulatory Visit: Payer: Self-pay | Admitting: Internal Medicine

## 2016-10-31 DIAGNOSIS — R1011 Right upper quadrant pain: Principal | ICD-10-CM

## 2016-10-31 DIAGNOSIS — R109 Unspecified abdominal pain: Secondary | ICD-10-CM | POA: Diagnosis not present

## 2016-10-31 DIAGNOSIS — G8929 Other chronic pain: Secondary | ICD-10-CM

## 2016-10-31 MED ORDER — IOPAMIDOL (ISOVUE-300) INJECTION 61%
100.0000 mL | Freq: Once | INTRAVENOUS | Status: AC | PRN
  Administered 2016-10-31: 100 mL via INTRAVENOUS

## 2016-11-20 ENCOUNTER — Other Ambulatory Visit: Payer: BLUE CROSS/BLUE SHIELD

## 2016-11-21 ENCOUNTER — Other Ambulatory Visit: Payer: BLUE CROSS/BLUE SHIELD

## 2016-12-11 ENCOUNTER — Ambulatory Visit: Payer: BLUE CROSS/BLUE SHIELD | Admitting: Family Medicine

## 2016-12-20 ENCOUNTER — Ambulatory Visit (INDEPENDENT_AMBULATORY_CARE_PROVIDER_SITE_OTHER): Payer: BLUE CROSS/BLUE SHIELD | Admitting: Internal Medicine

## 2016-12-20 ENCOUNTER — Encounter: Payer: Self-pay | Admitting: Internal Medicine

## 2016-12-20 VITALS — BP 128/70 | HR 84 | Temp 98.3°F | Ht 72.0 in | Wt 214.2 lb

## 2016-12-20 DIAGNOSIS — J069 Acute upper respiratory infection, unspecified: Secondary | ICD-10-CM

## 2016-12-20 DIAGNOSIS — B9789 Other viral agents as the cause of diseases classified elsewhere: Secondary | ICD-10-CM | POA: Diagnosis not present

## 2016-12-20 NOTE — Patient Instructions (Addendum)
Acute bronchitis symptoms are generally not helped by antibiotics.  Take over-the-counter expectorants and cough medications such as  Mucinex DM.  Call if there is no improvement in 5 to 7 days or if  you develop worsening cough, fever, or new symptoms, such as shortness of breath or chest pain.  Hydrate and Humidify   Drink enough water to keep your urine clear or pale yellow. Staying hydrated will help to thin your mucus.  Use a cool mist humidifier to keep the humidity level in your home above 50%.  Inhale steam for 10-15 minutes, 3-4 times a day or as told by your health care provider. You can do this in the bathroom while a hot shower is running.  Limit your exposure to cool or dry air.

## 2016-12-20 NOTE — Progress Notes (Signed)
Pre visit review using our clinic review tool, if applicable. No additional management support is needed unless otherwise documented below in the visit note. 

## 2016-12-20 NOTE — Progress Notes (Signed)
Subjective:    Patient ID: Lee Holt, male    DOB: Jan 27, 1971, 46 y.o.   MRN: [id number]  HPI 46 year old patient who had a URI about 3 weeks ago.  Complaints today include persistent nonproductive cough.  Cough is aggravated by anxiety.  Does not interfere with sleep.  No sputum production.  In general feels well. Has been off tobacco products for about 10 months No fever  Past Medical History:  Diagnosis Date  . TENSION HEADACHES, CHRONIC 04/03/2008  . TOBACCO USER 08/05/2009     Social History   Social History  . Marital status: Single    Spouse name: N/A  . Number of children: N/A  . Years of education: N/A   Occupational History  . Not on file.   Social History Main Topics  . Smoking status: Former Smoker    Packs/day: 0.50    Types: Cigarettes    Quit date: 02/25/2016  . Smokeless tobacco: Never Used  . Alcohol use 0.0 oz/week     Comment: occasionaly  . Drug use: No  . Sexual activity: Not on file   Other Topics Concern  . Not on file   Social History Narrative  . No narrative on file    No past surgical history on file.  Family History  Problem Relation Age of Onset  . Hypertension Mother     No Known Allergies  Current Outpatient Prescriptions on File Prior to Visit  Medication Sig Dispense Refill  . acyclovir (ZOVIRAX) 400 MG tablet Take 1 tablet (400 mg total) by mouth 2 (two) times daily. 20 tablet 2  . finasteride (PROPECIA) 1 MG tablet Take 1 tablet (1 mg total) by mouth daily. 90 tablet 3   No current facility-administered medications on file prior to visit.     BP 128/70 (BP Location: Left Arm, Patient Position: Sitting, Cuff Size: Normal)   Pulse 84   Temp 98.3 F (36.8 C) (Oral)   Ht 6' (1.829 m)   Wt 214 lb 3.2 oz (97.2 kg)   SpO2 98%   BMI 29.05 kg/m      Review of Systems  Constitutional: Negative for appetite change, chills, fatigue and fever.  HENT: Negative for congestion, dental problem, ear pain, hearing  loss, sore throat, tinnitus, trouble swallowing and voice change.   Eyes: Negative for pain, discharge and visual disturbance.  Respiratory: Positive for cough. Negative for chest tightness, wheezing and stridor.   Cardiovascular: Negative for chest pain, palpitations and leg swelling.  Gastrointestinal: Negative for abdominal distention, abdominal pain, blood in stool, constipation, diarrhea, nausea and vomiting.  Genitourinary: Negative for difficulty urinating, discharge, flank pain, genital sores, hematuria and urgency.  Musculoskeletal: Negative for arthralgias, back pain, gait problem, joint swelling, myalgias and neck stiffness.  Skin: Negative for rash.  Neurological: Negative for dizziness, syncope, speech difficulty, weakness, numbness and headaches.  Hematological: Negative for adenopathy. Does not bruise/bleed easily.  Psychiatric/Behavioral: Negative for behavioral problems and dysphoric mood. The patient is not nervous/anxious.        Objective:   Physical Exam  Constitutional: He is oriented to person, place, and time. He appears well-developed.  HENT:  Head: Normocephalic.  Right Ear: External ear normal.  Left Ear: External ear normal.  Eyes: Conjunctivae and EOM are normal.  Neck: Normal range of motion.  Cardiovascular: Normal rate and normal heart sounds.   Pulmonary/Chest: Breath sounds normal.  Abdominal: Bowel sounds are normal.  Musculoskeletal: Normal range of motion. He exhibits no edema  or tenderness.  Neurological: He is alert and oriented to person, place, and time.  Psychiatric: He has a normal mood and affect. His behavior is normal.          Assessment & Plan:   Status post URI with persistent postviral cough.  Will treat symptomatically Patient reassured  Rogelia Boga

## 2017-02-02 DIAGNOSIS — J301 Allergic rhinitis due to pollen: Secondary | ICD-10-CM | POA: Diagnosis not present

## 2017-02-02 DIAGNOSIS — R05 Cough: Secondary | ICD-10-CM | POA: Diagnosis not present

## 2017-03-08 DIAGNOSIS — F331 Major depressive disorder, recurrent, moderate: Secondary | ICD-10-CM | POA: Diagnosis not present

## 2017-03-28 DIAGNOSIS — F410 Panic disorder [episodic paroxysmal anxiety] without agoraphobia: Secondary | ICD-10-CM | POA: Diagnosis not present

## 2017-03-28 DIAGNOSIS — F331 Major depressive disorder, recurrent, moderate: Secondary | ICD-10-CM | POA: Diagnosis not present

## 2017-03-30 DIAGNOSIS — F330 Major depressive disorder, recurrent, mild: Secondary | ICD-10-CM | POA: Diagnosis not present

## 2017-03-30 DIAGNOSIS — F411 Generalized anxiety disorder: Secondary | ICD-10-CM | POA: Diagnosis not present

## 2017-04-23 DIAGNOSIS — Z125 Encounter for screening for malignant neoplasm of prostate: Secondary | ICD-10-CM | POA: Diagnosis not present

## 2017-04-23 DIAGNOSIS — Z23 Encounter for immunization: Secondary | ICD-10-CM | POA: Diagnosis not present

## 2017-04-23 DIAGNOSIS — R1011 Right upper quadrant pain: Secondary | ICD-10-CM | POA: Diagnosis not present

## 2017-04-23 DIAGNOSIS — R197 Diarrhea, unspecified: Secondary | ICD-10-CM | POA: Diagnosis not present

## 2017-04-26 DIAGNOSIS — F410 Panic disorder [episodic paroxysmal anxiety] without agoraphobia: Secondary | ICD-10-CM | POA: Diagnosis not present

## 2017-04-26 DIAGNOSIS — F330 Major depressive disorder, recurrent, mild: Secondary | ICD-10-CM | POA: Diagnosis not present

## 2017-05-08 ENCOUNTER — Other Ambulatory Visit: Payer: Self-pay | Admitting: Internal Medicine

## 2017-05-16 DIAGNOSIS — G8929 Other chronic pain: Secondary | ICD-10-CM | POA: Diagnosis not present

## 2017-05-16 DIAGNOSIS — R109 Unspecified abdominal pain: Secondary | ICD-10-CM | POA: Diagnosis not present

## 2017-05-16 DIAGNOSIS — M25561 Pain in right knee: Secondary | ICD-10-CM | POA: Diagnosis not present

## 2017-05-16 DIAGNOSIS — R05 Cough: Secondary | ICD-10-CM | POA: Diagnosis not present

## 2017-05-28 DIAGNOSIS — R194 Change in bowel habit: Secondary | ICD-10-CM | POA: Diagnosis not present

## 2017-05-28 DIAGNOSIS — R109 Unspecified abdominal pain: Secondary | ICD-10-CM | POA: Diagnosis not present

## 2017-05-31 ENCOUNTER — Other Ambulatory Visit: Payer: Self-pay | Admitting: Internal Medicine

## 2017-06-11 DIAGNOSIS — M1711 Unilateral primary osteoarthritis, right knee: Secondary | ICD-10-CM | POA: Diagnosis not present

## 2017-06-11 DIAGNOSIS — M7541 Impingement syndrome of right shoulder: Secondary | ICD-10-CM | POA: Diagnosis not present

## 2017-07-05 ENCOUNTER — Encounter: Payer: Self-pay | Admitting: Internal Medicine

## 2017-07-17 DIAGNOSIS — R109 Unspecified abdominal pain: Secondary | ICD-10-CM | POA: Diagnosis not present

## 2017-08-14 DIAGNOSIS — L237 Allergic contact dermatitis due to plants, except food: Secondary | ICD-10-CM | POA: Diagnosis not present

## 2017-10-29 DIAGNOSIS — R195 Other fecal abnormalities: Secondary | ICD-10-CM | POA: Diagnosis not present

## 2017-10-29 DIAGNOSIS — R194 Change in bowel habit: Secondary | ICD-10-CM | POA: Diagnosis not present

## 2017-11-22 DIAGNOSIS — R21 Rash and other nonspecific skin eruption: Secondary | ICD-10-CM | POA: Diagnosis not present

## 2017-11-22 DIAGNOSIS — L659 Nonscarring hair loss, unspecified: Secondary | ICD-10-CM | POA: Diagnosis not present

## 2017-12-25 DIAGNOSIS — L821 Other seborrheic keratosis: Secondary | ICD-10-CM | POA: Diagnosis not present

## 2017-12-25 DIAGNOSIS — L218 Other seborrheic dermatitis: Secondary | ICD-10-CM | POA: Diagnosis not present

## 2017-12-25 DIAGNOSIS — D225 Melanocytic nevi of trunk: Secondary | ICD-10-CM | POA: Diagnosis not present

## 2017-12-25 DIAGNOSIS — D1801 Hemangioma of skin and subcutaneous tissue: Secondary | ICD-10-CM | POA: Diagnosis not present

## 2018-01-23 DIAGNOSIS — M1711 Unilateral primary osteoarthritis, right knee: Secondary | ICD-10-CM | POA: Diagnosis not present

## 2018-02-18 DIAGNOSIS — M1711 Unilateral primary osteoarthritis, right knee: Secondary | ICD-10-CM | POA: Diagnosis not present

## 2018-03-07 DIAGNOSIS — Z125 Encounter for screening for malignant neoplasm of prostate: Secondary | ICD-10-CM | POA: Diagnosis not present

## 2018-03-07 DIAGNOSIS — Z13220 Encounter for screening for lipoid disorders: Secondary | ICD-10-CM | POA: Diagnosis not present

## 2018-03-07 DIAGNOSIS — Z0001 Encounter for general adult medical examination with abnormal findings: Secondary | ICD-10-CM | POA: Diagnosis not present

## 2018-03-14 DIAGNOSIS — Z0000 Encounter for general adult medical examination without abnormal findings: Secondary | ICD-10-CM | POA: Diagnosis not present

## 2018-04-15 DIAGNOSIS — M5414 Radiculopathy, thoracic region: Secondary | ICD-10-CM | POA: Diagnosis not present

## 2018-04-15 DIAGNOSIS — M546 Pain in thoracic spine: Secondary | ICD-10-CM | POA: Diagnosis not present

## 2018-04-17 DIAGNOSIS — L570 Actinic keratosis: Secondary | ICD-10-CM | POA: Diagnosis not present

## 2018-04-17 DIAGNOSIS — D229 Melanocytic nevi, unspecified: Secondary | ICD-10-CM | POA: Diagnosis not present

## 2018-04-17 DIAGNOSIS — L719 Rosacea, unspecified: Secondary | ICD-10-CM | POA: Diagnosis not present

## 2018-06-27 IMAGING — US US ABDOMEN LIMITED
1 series · 14 of 25 positions shown · non-contrast
Comparison: None.

CLINICAL DATA: Right upper quadrant pain for 2 weeks

EXAM:
US ABDOMEN LIMITED - RIGHT UPPER QUADRANT

[Series 1: us abdomen limited · 0.28mm/px · 14 of 47 slices shown]
[im 1/47]
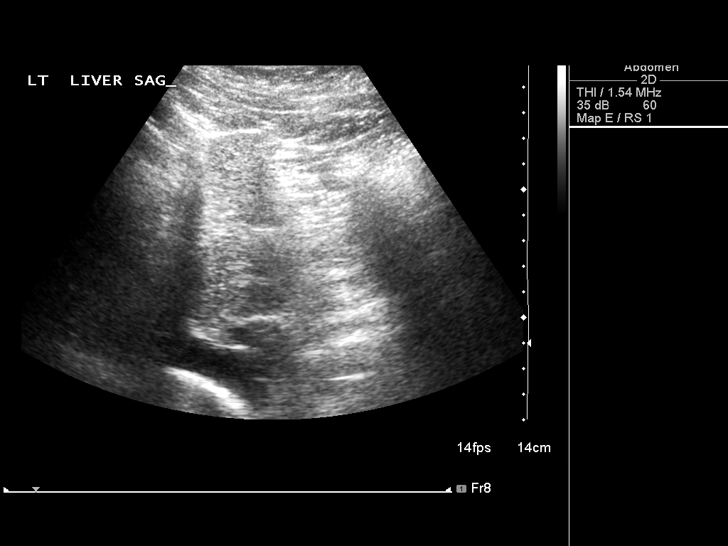
[im 4/47]
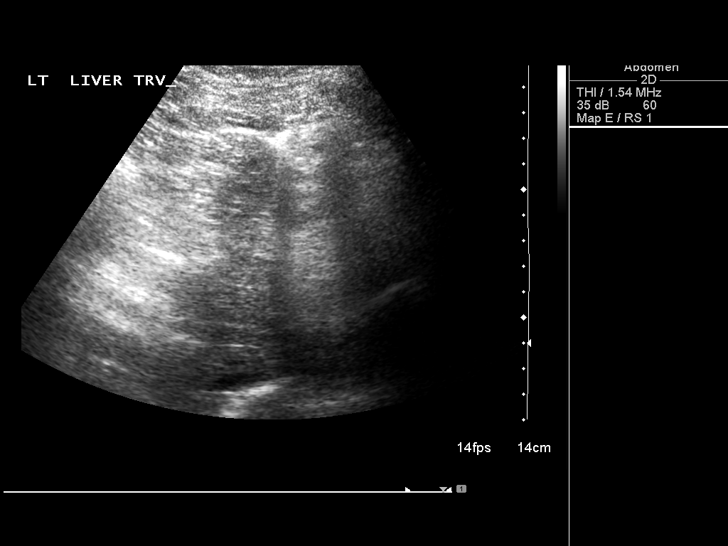
[im 8/47]
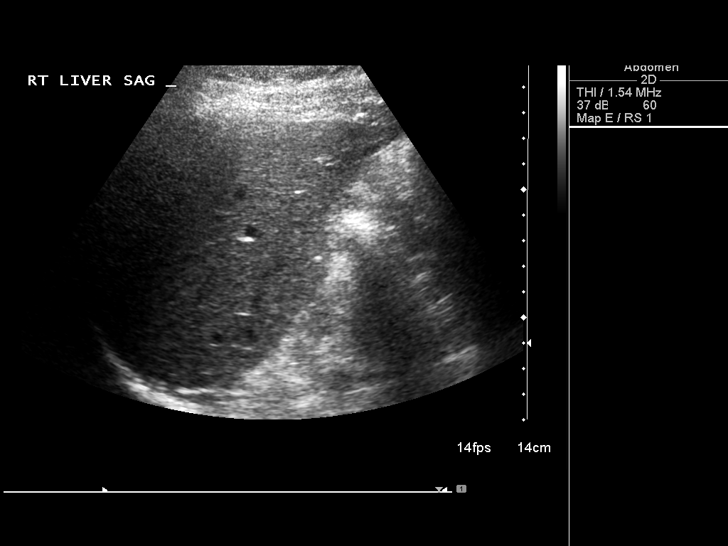
[im 12/47]
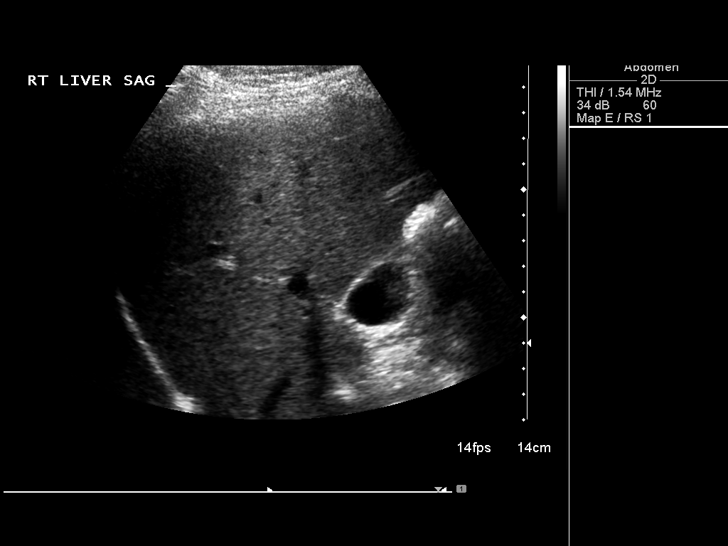
[im 16/47]
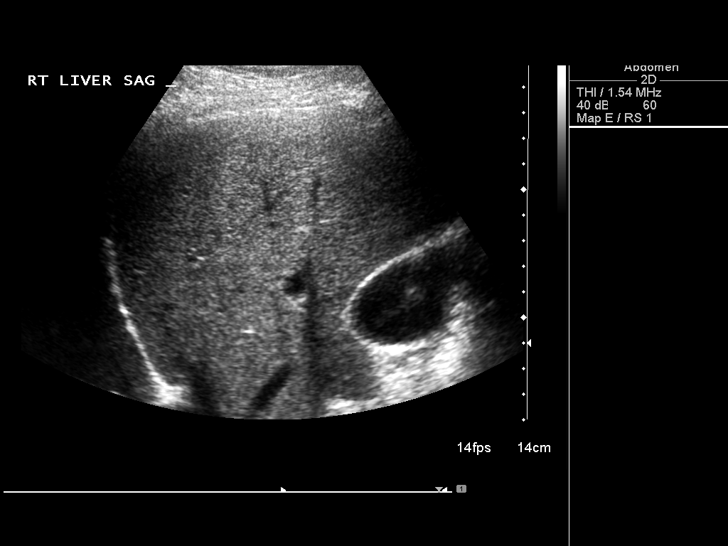
[im 18/47]
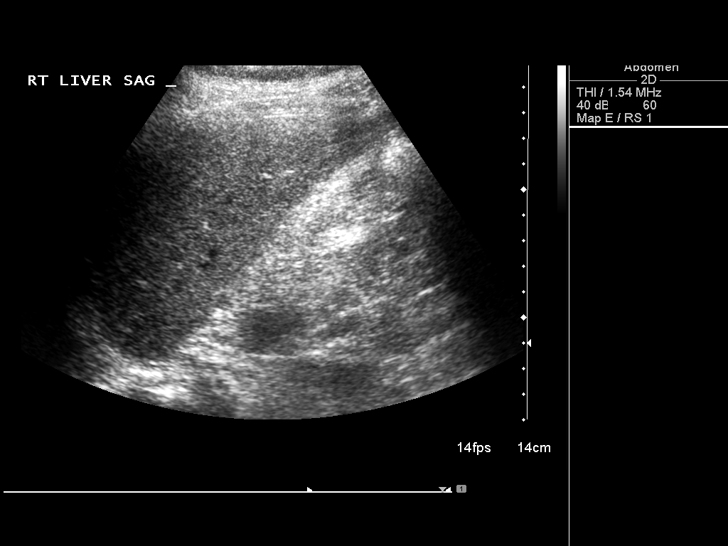
[im 22/47]
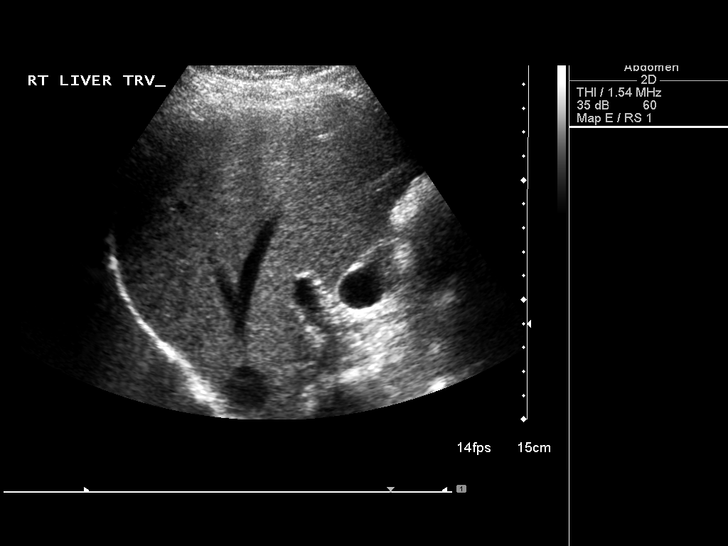
[im 25/47]
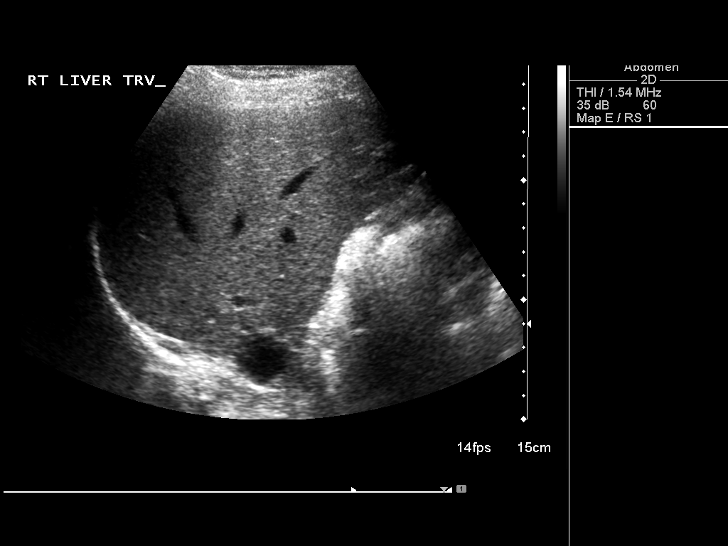
[im 29/47]
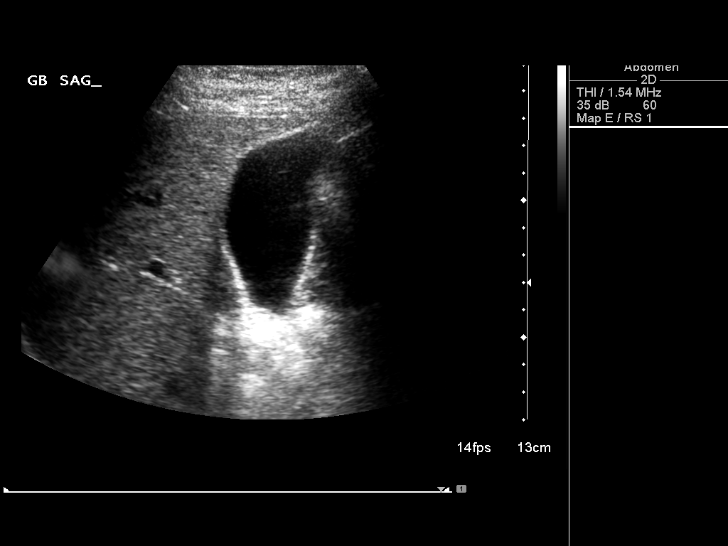
[im 31/47]
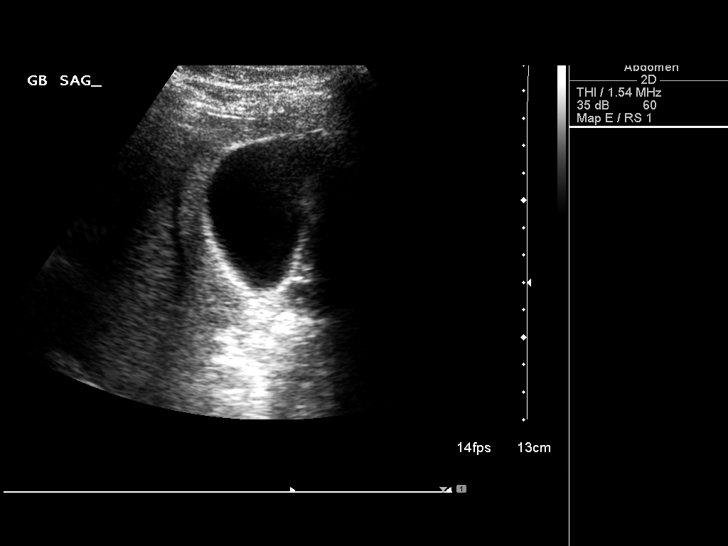
[im 35/47]
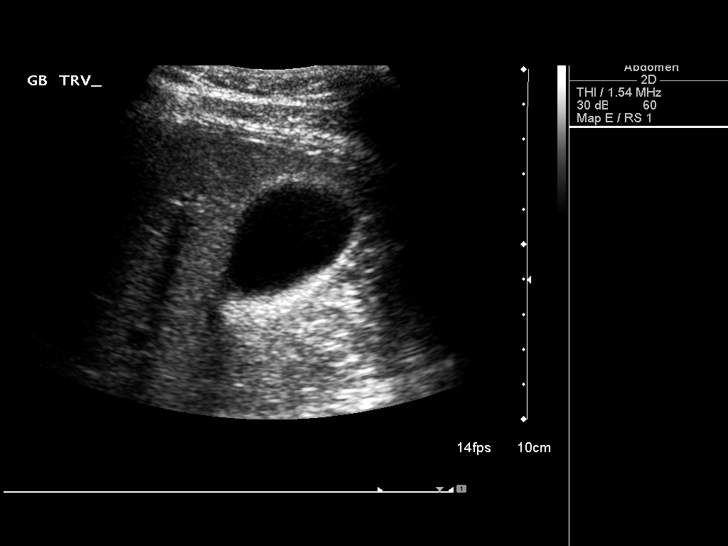
[im 39/47]
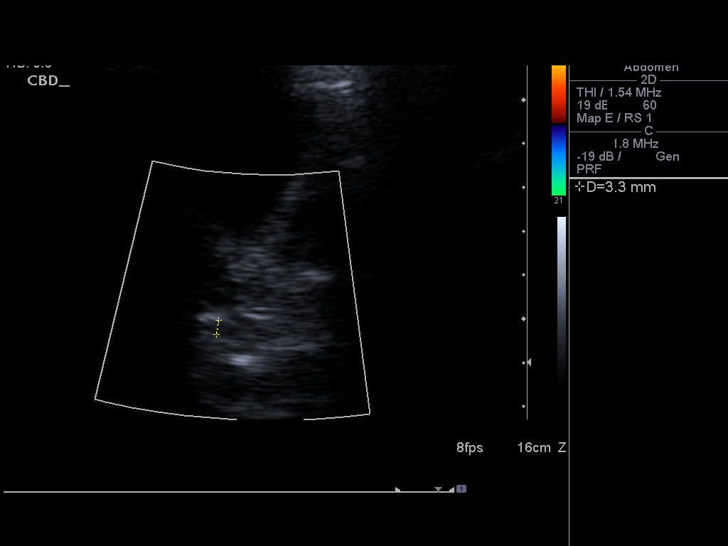
[im 43/47]
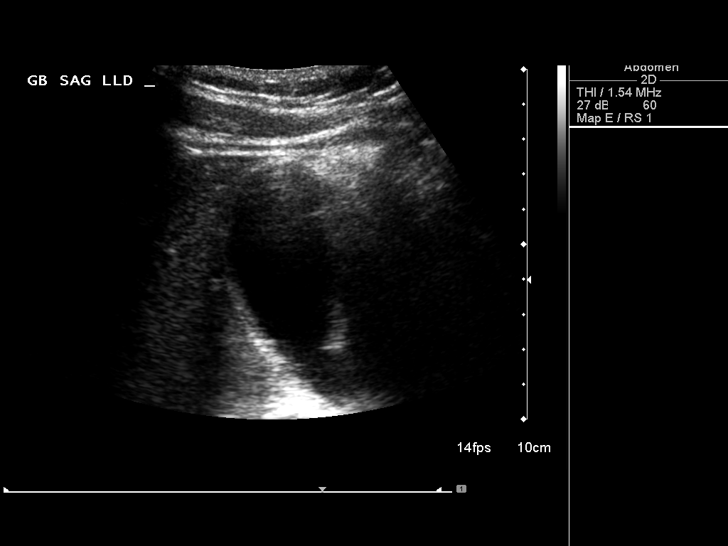
[im 47/47]
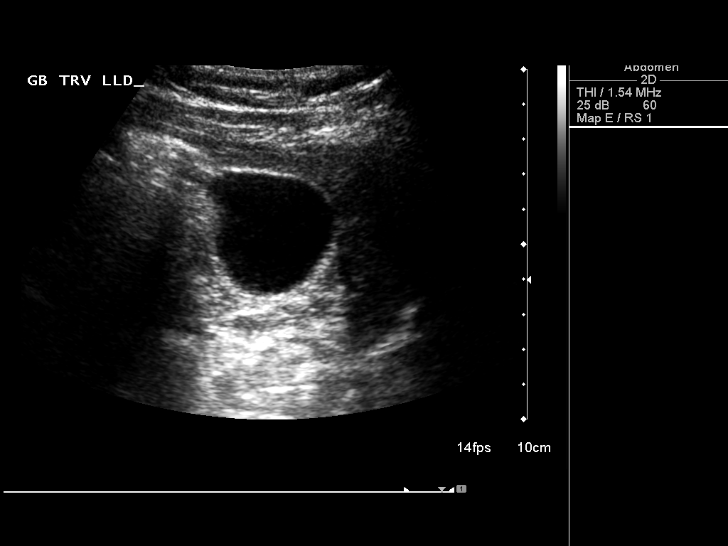

[14 of 25 positions shown; findings below may reference images not displayed]

FINDINGS: Gallbladder:

No gallstones or wall thickening visualized. No sonographic Murphy
sign noted by sonographer.

Common bile duct:

Diameter: Normal caliber, 3 mm

Liver:

No focal lesion identified. Within normal limits in parenchymal
echogenicity.
IMPRESSION: Unremarkable right upper quadrant ultrasound.

## 2018-07-16 DIAGNOSIS — L570 Actinic keratosis: Secondary | ICD-10-CM | POA: Diagnosis not present

## 2018-07-16 DIAGNOSIS — L821 Other seborrheic keratosis: Secondary | ICD-10-CM | POA: Diagnosis not present

## 2018-09-18 DIAGNOSIS — R399 Unspecified symptoms and signs involving the genitourinary system: Secondary | ICD-10-CM | POA: Diagnosis not present

## 2018-09-18 DIAGNOSIS — Z125 Encounter for screening for malignant neoplasm of prostate: Secondary | ICD-10-CM | POA: Diagnosis not present

## 2018-11-29 DIAGNOSIS — R3915 Urgency of urination: Secondary | ICD-10-CM | POA: Diagnosis not present

## 2018-11-29 DIAGNOSIS — R351 Nocturia: Secondary | ICD-10-CM | POA: Diagnosis not present

## 2018-11-29 DIAGNOSIS — R350 Frequency of micturition: Secondary | ICD-10-CM | POA: Diagnosis not present

## 2019-04-28 DIAGNOSIS — Z1159 Encounter for screening for other viral diseases: Secondary | ICD-10-CM | POA: Diagnosis not present

## 2019-04-28 DIAGNOSIS — R05 Cough: Secondary | ICD-10-CM | POA: Diagnosis not present

## 2019-05-20 DIAGNOSIS — Z125 Encounter for screening for malignant neoplasm of prostate: Secondary | ICD-10-CM | POA: Diagnosis not present

## 2019-05-20 DIAGNOSIS — Z0000 Encounter for general adult medical examination without abnormal findings: Secondary | ICD-10-CM | POA: Diagnosis not present

## 2019-05-26 DIAGNOSIS — Z0000 Encounter for general adult medical examination without abnormal findings: Secondary | ICD-10-CM | POA: Diagnosis not present

## 2019-06-05 DIAGNOSIS — Z1211 Encounter for screening for malignant neoplasm of colon: Secondary | ICD-10-CM | POA: Diagnosis not present

## 2019-07-04 DIAGNOSIS — Z1211 Encounter for screening for malignant neoplasm of colon: Secondary | ICD-10-CM | POA: Diagnosis not present

## 2019-07-04 DIAGNOSIS — K5730 Diverticulosis of large intestine without perforation or abscess without bleeding: Secondary | ICD-10-CM | POA: Diagnosis not present

## 2019-07-04 DIAGNOSIS — D128 Benign neoplasm of rectum: Secondary | ICD-10-CM | POA: Diagnosis not present

## 2019-07-04 DIAGNOSIS — K621 Rectal polyp: Secondary | ICD-10-CM | POA: Diagnosis not present

## 2019-08-03 DIAGNOSIS — H5712 Ocular pain, left eye: Secondary | ICD-10-CM | POA: Diagnosis not present

## 2019-08-03 DIAGNOSIS — S0502XA Injury of conjunctiva and corneal abrasion without foreign body, left eye, initial encounter: Secondary | ICD-10-CM | POA: Diagnosis not present

## 2019-09-05 ENCOUNTER — Other Ambulatory Visit: Payer: Self-pay

## 2019-09-05 DIAGNOSIS — Z20822 Contact with and (suspected) exposure to covid-19: Secondary | ICD-10-CM

## 2019-09-08 LAB — NOVEL CORONAVIRUS, NAA: SARS-CoV-2, NAA: NOT DETECTED

## 2019-10-07 ENCOUNTER — Ambulatory Visit: Payer: BC Managed Care – PPO | Attending: Internal Medicine

## 2019-10-07 DIAGNOSIS — Z20828 Contact with and (suspected) exposure to other viral communicable diseases: Secondary | ICD-10-CM | POA: Diagnosis not present

## 2019-10-07 DIAGNOSIS — Z20822 Contact with and (suspected) exposure to covid-19: Secondary | ICD-10-CM

## 2019-10-09 LAB — NOVEL CORONAVIRUS, NAA: SARS-CoV-2, NAA: NOT DETECTED

## 2019-12-01 ENCOUNTER — Ambulatory Visit: Payer: BC Managed Care – PPO | Attending: Internal Medicine

## 2019-12-01 DIAGNOSIS — Z20822 Contact with and (suspected) exposure to covid-19: Secondary | ICD-10-CM

## 2019-12-02 LAB — NOVEL CORONAVIRUS, NAA: SARS-CoV-2, NAA: NOT DETECTED

## 2020-02-26 ENCOUNTER — Ambulatory Visit: Payer: BC Managed Care – PPO | Admitting: Physician Assistant

## 2020-03-24 DIAGNOSIS — M25531 Pain in right wrist: Secondary | ICD-10-CM | POA: Diagnosis not present

## 2020-03-30 ENCOUNTER — Ambulatory Visit: Payer: BC Managed Care – PPO | Admitting: Physician Assistant

## 2020-04-05 DIAGNOSIS — L570 Actinic keratosis: Secondary | ICD-10-CM | POA: Diagnosis not present

## 2020-04-05 DIAGNOSIS — L821 Other seborrheic keratosis: Secondary | ICD-10-CM | POA: Diagnosis not present

## 2020-04-05 DIAGNOSIS — D225 Melanocytic nevi of trunk: Secondary | ICD-10-CM | POA: Diagnosis not present

## 2020-04-05 DIAGNOSIS — L814 Other melanin hyperpigmentation: Secondary | ICD-10-CM | POA: Diagnosis not present

## 2020-04-28 DIAGNOSIS — Z20822 Contact with and (suspected) exposure to covid-19: Secondary | ICD-10-CM | POA: Diagnosis not present

## 2020-05-06 ENCOUNTER — Ambulatory Visit: Payer: BC Managed Care – PPO | Admitting: Physician Assistant

## 2020-05-19 DIAGNOSIS — Z0000 Encounter for general adult medical examination without abnormal findings: Secondary | ICD-10-CM | POA: Diagnosis not present

## 2020-05-19 DIAGNOSIS — Z13220 Encounter for screening for lipoid disorders: Secondary | ICD-10-CM | POA: Diagnosis not present

## 2020-05-19 DIAGNOSIS — Z125 Encounter for screening for malignant neoplasm of prostate: Secondary | ICD-10-CM | POA: Diagnosis not present

## 2020-05-25 ENCOUNTER — Ambulatory Visit: Payer: BC Managed Care – PPO | Admitting: Physician Assistant

## 2020-06-22 DIAGNOSIS — F418 Other specified anxiety disorders: Secondary | ICD-10-CM | POA: Diagnosis not present

## 2020-06-22 DIAGNOSIS — J029 Acute pharyngitis, unspecified: Secondary | ICD-10-CM | POA: Diagnosis not present

## 2020-09-06 ENCOUNTER — Ambulatory Visit: Payer: BLUE CROSS/BLUE SHIELD | Admitting: Podiatry

## 2020-09-15 ENCOUNTER — Ambulatory Visit: Payer: Self-pay | Admitting: Podiatry

## 2020-09-20 ENCOUNTER — Other Ambulatory Visit: Payer: Self-pay

## 2020-09-20 ENCOUNTER — Ambulatory Visit (INDEPENDENT_AMBULATORY_CARE_PROVIDER_SITE_OTHER): Payer: BC Managed Care – PPO | Admitting: Podiatry

## 2020-09-20 ENCOUNTER — Ambulatory Visit (INDEPENDENT_AMBULATORY_CARE_PROVIDER_SITE_OTHER): Payer: BC Managed Care – PPO

## 2020-09-20 DIAGNOSIS — M21622 Bunionette of left foot: Secondary | ICD-10-CM

## 2020-09-20 DIAGNOSIS — M2042 Other hammer toe(s) (acquired), left foot: Secondary | ICD-10-CM

## 2020-09-20 DIAGNOSIS — M21619 Bunion of unspecified foot: Secondary | ICD-10-CM | POA: Diagnosis not present

## 2020-09-20 DIAGNOSIS — M21621 Bunionette of right foot: Secondary | ICD-10-CM

## 2020-09-21 NOTE — Progress Notes (Signed)
   Subjective: 48 y.o. male presenting today for evaluation of a symptomatic bunion and hammertoe deformity to the bilateral feet left greater than the right.  Patient states that it is aggravating him and causing some irritation.  He is very active and plays tennis.  This is been ongoing for several years despite conservative treatments including shoe gear modifications with minimal relief or improvement.  He presents for further treatment and evaluation  Past Medical History:  Diagnosis Date  . TENSION HEADACHES, CHRONIC 04/03/2008  . TOBACCO USER 08/05/2009    Objective: Physical Exam General: The patient is alert and oriented x3 in no acute distress.  Dermatology: Skin is cool, dry and supple bilateral lower extremities. Negative for open lesions or macerations.  Vascular: Palpable pedal pulses bilaterally. No edema or erythema noted. Capillary refill within normal limits.  Neurological: Epicritic and protective threshold grossly intact bilaterally.   Musculoskeletal Exam: Clinical evidence of bunion deformity noted to the respective foot. There is moderate pain on palpation range of motion of the first MPJ. Lateral deviation of the hallux noted consistent with hallux abductovalgus. Hammertoe contracture also noted on clinical exam to digits #2 of the left foot. Symptomatic pain on palpation and range of motion also noted to the metatarsal phalangeal joints of the respective hammertoe digits.    Radiographic Exam: Increased intermetatarsal angle greater than 15 with a hallux abductus angle greater than 30 noted on AP view. Moderate degenerative changes noted within the first MPJ. Contracture deformity also noted to the interphalangeal joints and MPJs of the digits of the respective hammertoes.    Assessment: 1. HAV w/ bunion deformity bilateral 2. Hammertoe deformity second digit left 3.  Tailor's bunionette deformity bilateral   Plan of Care:  1. Patient was evaluated. X-Rays  reviewed. 2. Today we discussed the conservative versus surgical management of the presenting pathology. The patient opts for surgical management. All possible complications and details of the procedure were explained. All patient questions were answered. No guarantees were expressed or implied. 3. Authorization for surgery was initiated today. Surgery will consist of bunionectomy with first metatarsal osteotomy left.  Tailor's bunionectomy with metatarsal osteotomy left.  PIPJ arthroplasty with MTPJ capsulotomy second left 4.  Return to clinic 1 week postop  *Goes by [employer].  Plays tennis.  Event organiser for textile      Felecia Shelling, DPM [facility]  Dr. Felecia Shelling, DPM    [address]                                        [place], [place] [address]                Office [phone]  Fax [phone]

## 2020-12-10 DIAGNOSIS — M545 Low back pain, unspecified: Secondary | ICD-10-CM | POA: Diagnosis not present

## 2020-12-10 DIAGNOSIS — M25551 Pain in right hip: Secondary | ICD-10-CM | POA: Diagnosis not present

## 2021-04-05 ENCOUNTER — Encounter: Payer: Self-pay | Admitting: Family Medicine

## 2021-04-05 ENCOUNTER — Other Ambulatory Visit: Payer: Self-pay

## 2021-04-05 ENCOUNTER — Ambulatory Visit (INDEPENDENT_AMBULATORY_CARE_PROVIDER_SITE_OTHER): Payer: BC Managed Care – PPO | Admitting: Family Medicine

## 2021-04-05 DIAGNOSIS — L821 Other seborrheic keratosis: Secondary | ICD-10-CM | POA: Diagnosis not present

## 2021-04-05 DIAGNOSIS — M545 Low back pain, unspecified: Secondary | ICD-10-CM

## 2021-04-05 DIAGNOSIS — L814 Other melanin hyperpigmentation: Secondary | ICD-10-CM | POA: Diagnosis not present

## 2021-04-05 DIAGNOSIS — D225 Melanocytic nevi of trunk: Secondary | ICD-10-CM | POA: Diagnosis not present

## 2021-04-05 DIAGNOSIS — G8929 Other chronic pain: Secondary | ICD-10-CM

## 2021-04-05 DIAGNOSIS — L570 Actinic keratosis: Secondary | ICD-10-CM | POA: Diagnosis not present

## 2021-04-05 NOTE — Progress Notes (Signed)
Office Visit Note   Patient: Lee Holt           Date of Birth: 1971-07-14           MRN: [id number] Visit Date: 04/05/2021 Requested by: No referring provider defined for this encounter. PCP: Gordy Savers, MD (Inactive)  Subjective: Chief Complaint  Patient presents with   Lower Back - Pain    Has been having pain in the lower back since January -- "tennis-related." He will have the pain after playing tennis and a few days afterward. Has been playing tennis since the age of 35. Went to [facility], had xrays and a right SI joint injection on 12/10/20. Has also been seeing Lorenda Peck, PT. Has not been able to play tennis -- his ultimate goal is to play tennis again.     HPI: He is here with right-sided posterior hip/low back pain.  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  The pain persisted so he went to his chiropractor.  Treatments did not eliminate his symptoms so he went to [facility] orthopedics where he had x-rays obtained and was given a right SI joint injection.  Unfortunately this did not help.  He then began seeing Lorenda Peck and has had some improvement with physical therapy dry needling, but after 6 visits his pain had not gone away completely and he was referred here for evaluation.  Denies any radicular pain, denies any numbness or weakness.  With day-to-day activities, his symptoms are minimal but he is an avid Armed forces operational officer and has been unable to hit a backhand, it is very painful to put all of his weight on his right leg and then twists his upper body.  He is otherwise been in good health.  He has a family history of heart disease.                ROS:   All other systems were reviewed and are negative.  Objective: Vital Signs: There were no vitals taken for this visit.  Physical Exam:  General:  Alert and oriented, in no acute distress. Pulm:  Breathing unlabored. Psy:  Normal mood,  congruent affect. Skin: No rash Low back: He does have several "joint mice" just lateral to the right SI joint.  He is tender to palpation along the SI joint.  No significant spine tenderness, no pain in the sciatic notch.  No pain with internal hip rotation.  Straight leg raise is negative, lower extremity strength and reflexes are normal.   Imaging: No results found.  Assessment & Plan: Right posterior hip/low back pain, suspect sacroiliac dysfunction.  Cannot rule out lumbar disc protrusion. -Discussed options including obtaining MRI scan of the pelvis and/or lumbar spine.  He has a high deductible and would like to wait on this for now.  He will continue with conservative treatment and if symptoms persist, then he will notify me and I will order additional testing.     Procedures: No procedures performed        PMFS History: Patient Active Problem List   Diagnosis Date Noted   Right knee pain 05/07/2014   Elbow pain 08/08/2012   Medial epicondylitis of right elbow 08/08/2012   TOBACCO USER 08/05/2009   TENSION HEADACHES, CHRONIC 04/03/2008   Past Medical History:  Diagnosis Date   TENSION HEADACHES, CHRONIC 04/03/2008   TOBACCO USER 08/05/2009    Family History  Problem Relation Age of Onset  Hypertension Mother     History reviewed. No pertinent surgical history. Social History   Occupational History   Not on file  Tobacco Use   Smoking status: Former    Packs/day: 0.50    Pack years: 0.00    Types: Cigarettes    Quit date: 02/25/2016    Years since quitting: 5.1   Smokeless tobacco: Never  Substance and Sexual Activity   Alcohol use: Yes    Alcohol/week: 0.0 standard drinks    Comment: occasionaly   Drug use: No   Sexual activity: Not on file

## 2022-04-06 DIAGNOSIS — L570 Actinic keratosis: Secondary | ICD-10-CM | POA: Diagnosis not present

## 2022-04-06 DIAGNOSIS — L821 Other seborrheic keratosis: Secondary | ICD-10-CM | POA: Diagnosis not present

## 2022-04-06 DIAGNOSIS — L814 Other melanin hyperpigmentation: Secondary | ICD-10-CM | POA: Diagnosis not present

## 2022-04-06 DIAGNOSIS — D225 Melanocytic nevi of trunk: Secondary | ICD-10-CM | POA: Diagnosis not present

## 2022-12-22 LAB — LAB REPORT - SCANNED
A1c: 6.3
EGFR: 73
PSA, Total: 0.2

## 2023-01-02 ENCOUNTER — Ambulatory Visit: Payer: BC Managed Care – PPO | Admitting: Skilled Nursing Facility1

## 2023-02-14 ENCOUNTER — Encounter: Payer: BC Managed Care – PPO | Attending: Family Medicine | Admitting: Dietician

## 2023-02-14 ENCOUNTER — Encounter: Payer: Self-pay | Admitting: Dietician

## 2023-02-14 DIAGNOSIS — R7303 Prediabetes: Secondary | ICD-10-CM | POA: Insufficient documentation

## 2023-02-14 NOTE — Patient Instructions (Signed)
Aim for at least 150 minutes of physical activity weekly.  Goal: Eat at home for dinner 5 nights per week. Aim to make 1/4 plate protein, 1/4 plate complex carbs and 1/2 of your plate vegetables and/or fruit.  Goal: Drink 1 full stanley cup of water daily (40 oz).   Goal: exercise 3x/wk for at least 60 minutes.   Aim to eat within 1-2 hours of waking up and every 3-5 hours following.

## 2023-02-14 NOTE — Progress Notes (Signed)
Medical Nutrition Therapy  Appointment Start time:  1000  Appointment End time:  1115  Primary concerns today: Pt is concerned about prediabetes/A1c.    Referral diagnosis: hypercholesterolemia, prediabetes Preferred learning style: no preference indicated Learning readiness: ready   NUTRITION ASSESSMENT   Anthropometrics  Wt not assessed  Clinical Medical Hx: prediabetes, HLD Medications: ozempic Labs: 08/2021: A1c 6.3 Notable Signs/Symptoms: occasional diarrhea Food Allergies: none, pt states he suspects lactose intolerance  Lifestyle & Dietary Hx Pt is present today with his partner Lee Holt.  Pt states he and his partner did whole 30 diet a few years ago and he likes the focus on eating more whole foods and following the Mediterranean style of eating.    Pt states they cook at home 2-3 times per week and he wants to start eating at home more but feels that buying groceries can get complicated when planning meals. Pt states he feels like he eats out a lot  Pt states he has been intermittent fasting since January 2024 and only eats from 12pm-8pm. Pt states he feels like sometimes he over-eats when he finally gets to eat.   Pt states he stopped eating red meat in January.   Pt reports he used to play tennis but has been having issues with his knees so he has recently been playing pickle ball.   Estimated daily fluid intake: 16-32 oz Supplements: none Sleep: 6-7 hours Stress / self-care: moderate stress Current average weekly physical activity: pickle ball 3x/wk for 1-2 hours,   24-Hr Dietary Recall First Meal: none Snack: none Second Meal: chicken salad on salad and fries Snack: fruits  Third Meal: 6 slices of pizza Snack: sorbet Beverages: coffee with cream, unsweet tea, water   NUTRITION DIAGNOSIS  [place]-2.2 Altered nutrition-related laboratory As related to prediabetes.  As evidenced by A1c 6.3.   NUTRITION INTERVENTION  Nutrition education (E-1) on the following  topics:  Explained the insulin resistance, actions of insulin in the body, pathophysiology of prediabetes, and what A1c value means Discussed MyPlate and strategies to build more balanced meals and snacks including more lean proteins, complex carbohydrates and non-starchy vegetables Explained the difference between saturated and unsaturated fat on health, how to look for them on the nutrient label, and how to determine what foods have more saturated fat Explained lactose intolerance and discussed low/high lactose foods Showed pt video about how to structure an easy grocery list with little planning and strategies to incorporate these foods into balanced meals Discussed risks of intermittent fasting such as going against the body's normal hunger/fullness cues, and discussed importance of consistent meal times to aid in portion control and mindfulness while eating Importance of adequate sleep on health and healing, along with the importance of adequate hydration Gave pt examples of complex carbohydrates, lean proteins, healthy fats, and non-starchy vs starchy vegetables  Handouts Provided Include  Easy Grocery List Meal Ideas MyPlate  Learning Style & Readiness for Change Teaching method utilized: Visual & Auditory  Demonstrated degree of understanding via: Teach Back  Barriers to learning/adherence to lifestyle change: none  Goals Established by Pt Aim for at least 150 minutes of physical activity weekly.  Goal: Eat at home for dinner 5 nights per week. Aim to make 1/4 plate protein, 1/4 plate complex carbs and 1/2 of your plate vegetables and/or fruit.  Goal: Drink 1 full stanley cup of water daily (40 oz).   Goal: exercise 3x/wk for at least 60 minutes.   Aim to eat within 1-2 hours  of waking up and every 3-5 hours following.    MONITORING & EVALUATION Dietary intake, weekly physical activity, and follow up PRN.  Next Steps  Patient is to call for questions.

## 2023-06-06 ENCOUNTER — Other Ambulatory Visit: Payer: Self-pay | Admitting: Family Medicine

## 2023-06-06 ENCOUNTER — Ambulatory Visit: Admission: RE | Admit: 2023-06-06 | Payer: BC Managed Care – PPO | Source: Ambulatory Visit

## 2023-06-06 DIAGNOSIS — M25561 Pain in right knee: Secondary | ICD-10-CM
